# Patient Record
Sex: Female | Born: 1985 | Race: Black or African American | Hispanic: No | Marital: Married | Smoking: Current every day smoker
Health system: Southern US, Community
[De-identification: ages and names within clinical notes are randomized; demographics above are authoritative.]

## PROBLEM LIST (undated history)

## (undated) DIAGNOSIS — R569 Unspecified convulsions: Secondary | ICD-10-CM

## (undated) DIAGNOSIS — D649 Anemia, unspecified: Secondary | ICD-10-CM

## (undated) DIAGNOSIS — I10 Essential (primary) hypertension: Secondary | ICD-10-CM

## (undated) DIAGNOSIS — Z789 Other specified health status: Secondary | ICD-10-CM

## (undated) DIAGNOSIS — Z8614 Personal history of Methicillin resistant Staphylococcus aureus infection: Secondary | ICD-10-CM

## (undated) HISTORY — PX: NO PAST SURGERIES: SHX2092

---

## 2009-10-11 ENCOUNTER — Emergency Department (HOSPITAL_COMMUNITY): Admission: EM | Admit: 2009-10-11 | Discharge: 2009-10-11 | Payer: Self-pay | Admitting: Emergency Medicine

## 2010-02-08 ENCOUNTER — Emergency Department (HOSPITAL_COMMUNITY): Admission: EM | Admit: 2010-02-08 | Discharge: 2010-02-08 | Payer: Self-pay | Admitting: Emergency Medicine

## 2010-07-20 NOTE — L&D Delivery Note (Addendum)
Delivery Note At 9:21 AM a viable female was delivered via Vaginal, Spontaneous Delivery (Presentation: Left Occiput Anterior).  APGAR: 9, 9; weight 7 lb 3.7 oz (3280 g).   Placenta status: Intact, Spontaneous.  Cord: 3 vessels with the following complications: PPH EBL 800cc, IV pitocin and cytotec 1000mg  PR given.   Anesthesia: Epidural  Episiotomy: None Lacerations: 1st degree;Perineal Suture Repair: 3.0 vicryl Est. Blood Loss (mL): 800  Mom to AICU for Magnesium x 24 hours.   Baby to nursery-stable. Blood pressure stable 114/74 Plans on IUD and bottle feeding.   Omar Orrego 06/01/2011, 9:55 AM

## 2010-10-04 LAB — POCT I-STAT, CHEM 8
BUN: 5 mg/dL — ABNORMAL LOW (ref 6–23)
Calcium, Ion: 1.12 mmol/L (ref 1.12–1.32)
Chloride: 107 mEq/L (ref 96–112)
Creatinine, Ser: 0.8 mg/dL (ref 0.4–1.2)
Glucose, Bld: 97 mg/dL (ref 70–99)
Potassium: 3.7 mEq/L (ref 3.5–5.1)
Sodium: 140 mEq/L (ref 135–145)

## 2010-10-04 LAB — DIFFERENTIAL
Basophils Absolute: 0.1 10*3/uL (ref 0.0–0.1)
Eosinophils Absolute: 0.1 10*3/uL (ref 0.0–0.7)
Lymphocytes Relative: 14 % (ref 12–46)
Lymphs Abs: 1.5 10*3/uL (ref 0.7–4.0)
Neutro Abs: 8.2 10*3/uL — ABNORMAL HIGH (ref 1.7–7.7)
Neutrophils Relative %: 80 % — ABNORMAL HIGH (ref 43–77)

## 2010-10-04 LAB — COMPREHENSIVE METABOLIC PANEL
ALT: 14 U/L (ref 0–35)
Albumin: 3.9 g/dL (ref 3.5–5.2)
Alkaline Phosphatase: 80 U/L (ref 39–117)
Creatinine, Ser: 0.68 mg/dL (ref 0.4–1.2)
Glucose, Bld: 97 mg/dL (ref 70–99)
Potassium: 3.5 mEq/L (ref 3.5–5.1)
Total Bilirubin: 0.4 mg/dL (ref 0.3–1.2)
Total Protein: 7.8 g/dL (ref 6.0–8.3)

## 2010-10-04 LAB — CULTURE, BLOOD (ROUTINE X 2): Culture: NO GROWTH

## 2010-10-04 LAB — LACTIC ACID, PLASMA: Lactic Acid, Venous: 1.5 mmol/L (ref 0.5–2.2)

## 2010-10-04 LAB — POCT CARDIAC MARKERS: Myoglobin, poc: 36.3 ng/mL (ref 12–200)

## 2010-10-04 LAB — CBC
Hemoglobin: 13.6 g/dL (ref 12.0–15.0)
Platelets: 194 10*3/uL (ref 150–400)
RDW: 14.9 % (ref 11.5–15.5)
WBC: 10.3 10*3/uL (ref 4.0–10.5)

## 2010-10-13 LAB — URINE MICROSCOPIC-ADD ON

## 2010-10-13 LAB — URINALYSIS, ROUTINE W REFLEX MICROSCOPIC
Bilirubin Urine: NEGATIVE
Ketones, ur: NEGATIVE mg/dL
Leukocytes, UA: NEGATIVE
Protein, ur: 30 mg/dL — AB
Specific Gravity, Urine: 1.02 (ref 1.005–1.030)
pH: 6.5 (ref 5.0–8.0)

## 2010-10-13 LAB — POCT I-STAT, CHEM 8
BUN: 9 mg/dL (ref 6–23)
Calcium, Ion: 1.16 mmol/L (ref 1.12–1.32)
Chloride: 109 mEq/L (ref 96–112)
Creatinine, Ser: 0.8 mg/dL (ref 0.4–1.2)
Glucose, Bld: 95 mg/dL (ref 70–99)
HCT: 41 % (ref 36.0–46.0)
Hemoglobin: 13.9 g/dL (ref 12.0–15.0)
Potassium: 4.3 mEq/L (ref 3.5–5.1)
Sodium: 143 mEq/L (ref 135–145)

## 2010-10-13 LAB — COMPREHENSIVE METABOLIC PANEL
BUN: 8 mg/dL (ref 6–23)
Calcium: 8.9 mg/dL (ref 8.4–10.5)
Creatinine, Ser: 0.81 mg/dL (ref 0.4–1.2)
Glucose, Bld: 97 mg/dL (ref 70–99)
Sodium: 142 mEq/L (ref 135–145)

## 2010-10-13 LAB — CBC: RDW: 14.9 % (ref 11.5–15.5)

## 2011-02-11 ENCOUNTER — Inpatient Hospital Stay (HOSPITAL_COMMUNITY): Payer: Medicaid Other

## 2011-02-11 ENCOUNTER — Emergency Department (HOSPITAL_COMMUNITY)
Admission: EM | Admit: 2011-02-11 | Discharge: 2011-02-11 | Disposition: A | Discharge status: Other Acute Inpatient Hospital | Payer: Medicaid Other | Source: Home / Self Care | Attending: Emergency Medicine | Admitting: Emergency Medicine

## 2011-02-11 ENCOUNTER — Encounter (HOSPITAL_COMMUNITY): Payer: Self-pay | Admitting: *Deleted

## 2011-02-11 ENCOUNTER — Inpatient Hospital Stay (HOSPITAL_COMMUNITY)
Admission: AD | Admit: 2011-02-11 | Discharge: 2011-02-11 | Disposition: A | Discharge status: Home / Self Care | Payer: Medicaid Other | Source: Ambulatory Visit | Attending: Obstetrics & Gynecology | Admitting: Obstetrics & Gynecology

## 2011-02-11 DIAGNOSIS — R109 Unspecified abdominal pain: Secondary | ICD-10-CM

## 2011-02-11 DIAGNOSIS — O0930 Supervision of pregnancy with insufficient antenatal care, unspecified trimester: Secondary | ICD-10-CM | POA: Insufficient documentation

## 2011-02-11 DIAGNOSIS — O9989 Other specified diseases and conditions complicating pregnancy, childbirth and the puerperium: Secondary | ICD-10-CM

## 2011-02-11 DIAGNOSIS — O26899 Other specified pregnancy related conditions, unspecified trimester: Secondary | ICD-10-CM

## 2011-02-11 HISTORY — DX: Essential (primary) hypertension: I10

## 2011-02-11 HISTORY — DX: Anemia, unspecified: D64.9

## 2011-02-11 LAB — CBC
MCH: 27 pg (ref 26.0–34.0)
MCV: 80.2 fL (ref 78.0–100.0)
RBC: 3.93 MIL/uL (ref 3.87–5.11)
WBC: 9.1 10*3/uL (ref 4.0–10.5)

## 2011-02-11 LAB — URINE MICROSCOPIC-ADD ON

## 2011-02-11 LAB — URINALYSIS, ROUTINE W REFLEX MICROSCOPIC
Ketones, ur: NEGATIVE mg/dL
Protein, ur: NEGATIVE mg/dL

## 2011-02-11 LAB — ABO/RH: ABO/RH(D): B POS

## 2011-02-11 LAB — BASIC METABOLIC PANEL
Calcium: 8.7 mg/dL (ref 8.4–10.5)
Chloride: 102 mEq/L (ref 96–112)
Creatinine, Ser: <0.47 mg/dL — ABNORMAL LOW (ref 0.50–1.10)
Potassium: 3.3 mEq/L — ABNORMAL LOW (ref 3.5–5.1)

## 2011-02-11 LAB — DIFFERENTIAL
Eosinophils Absolute: 0.2 10*3/uL (ref 0.0–0.7)
Eosinophils Relative: 2 % (ref 0–5)
Lymphs Abs: 1.8 10*3/uL (ref 0.7–4.0)
Neutro Abs: 6.7 10*3/uL (ref 1.7–7.7)

## 2011-02-11 NOTE — ED Provider Notes (Addendum)
History    Patient is 25 year old black female presents today from [facility]. Patient states that she began having some upper abdominal pain early this morning that radiated down to her lower abdomen. She states the pain would come and go. She denies vaginal discharge or bleeding. She states that she now is having minimal to no pain. She is uncertain of how far along she is.  No chief complaint on file.  HPI  OB History    Grav Para Term Preterm Abortions TAB SAB Ect Mult Living   5 4 2 2  0 0 0 0 0 4      Past Medical History  Diagnosis Date  . Hypertension   . Anemia     Past Surgical History  Procedure Date  . No past surgeries     No family history on file.  History  Substance Use Topics  . Smoking status: Not on file  . Smokeless tobacco: Not on file  . Alcohol Use: Not on file    Allergies:  Allergies  Allergen Reactions  . Promethazine Hcl Other (See Comments)    hallucinations    No prescriptions prior to admission    Review of Systems  Constitutional: Negative for fever and chills.  Eyes: Negative for blurred vision and double vision.  Cardiovascular: Negative for chest pain and palpitations.  Gastrointestinal: Positive for abdominal pain. Negative for nausea, vomiting, diarrhea and constipation.  Genitourinary: Negative for dysuria, urgency, frequency, hematuria and flank pain.  Neurological: Negative for dizziness and headaches.  Psychiatric/Behavioral: Negative for depression and suicidal ideas.   Physical Exam   Blood pressure 129/67, temperature 98.9 F (37.2 C), temperature source Oral, resp. rate 16.  Physical Exam  Constitutional: She is oriented to person, place, and time. She appears well-developed and well-nourished. No distress.  HENT:  Head: Normocephalic and atraumatic.  Eyes: EOM are normal. Pupils are equal, round, and reactive to light.  GI: Soft. She exhibits no distension and no mass. There is no tenderness. There is  no rebound and no guarding.  Genitourinary: No bleeding around the vagina.       The cervix is long and closed.  Neurological: She is alert and oriented to person, place, and time.  Skin: Skin is warm and dry. She is not diaphoretic.  Psychiatric: She has a normal mood and affect. Her behavior is normal. Judgment and thought content normal.    MAU Course  Procedures  NST is reactive for 24 weeks. No contractions are noted.  Ultrasound demonstrates a single living IUP with a gestational age of [redacted] weeks and 5 days. No fetal anomalies are seen involving the visualized anatomy. Her EDD is 05/29/2011. Her cervical length is 4.41 cm.  Assessment and Plan  Pregnancy with no prenatal care: I did discuss this with her at length. She plans to followup with Dr. Tamela Oddi. I did discuss with her private diet, activities, risks and precautions. She understood this and agreed. She had no other questions or problems at this time.  Clinton Gallant. Bonnita Newby III, DrHSc, MPAS, PA-C  02/11/2011, 5:57 PM

## 2011-02-11 NOTE — Progress Notes (Signed)
Pt having abdominal pain in early AM.

## 2011-03-26 ENCOUNTER — Inpatient Hospital Stay (HOSPITAL_COMMUNITY): Payer: Medicaid Other

## 2011-03-26 ENCOUNTER — Inpatient Hospital Stay (HOSPITAL_COMMUNITY)
Admission: AD | Admit: 2011-03-26 | Discharge: 2011-03-26 | Disposition: A | Discharge status: Home / Self Care | Payer: Medicaid Other | Source: Ambulatory Visit | Attending: Obstetrics & Gynecology | Admitting: Obstetrics & Gynecology

## 2011-03-26 ENCOUNTER — Encounter (HOSPITAL_COMMUNITY): Payer: Self-pay

## 2011-03-26 DIAGNOSIS — N949 Unspecified condition associated with female genital organs and menstrual cycle: Secondary | ICD-10-CM | POA: Insufficient documentation

## 2011-03-26 DIAGNOSIS — O169 Unspecified maternal hypertension, unspecified trimester: Secondary | ICD-10-CM | POA: Insufficient documentation

## 2011-03-26 DIAGNOSIS — O99330 Smoking (tobacco) complicating pregnancy, unspecified trimester: Secondary | ICD-10-CM | POA: Insufficient documentation

## 2011-03-26 DIAGNOSIS — O9989 Other specified diseases and conditions complicating pregnancy, childbirth and the puerperium: Secondary | ICD-10-CM | POA: Insufficient documentation

## 2011-03-26 LAB — RUBELLA SCREEN: Rubella: 11.6 IU/mL — ABNORMAL HIGH

## 2011-03-26 LAB — COMPREHENSIVE METABOLIC PANEL
BUN: <3 mg/dL — ABNORMAL LOW (ref 6–23)
CO2: 24 mEq/L (ref 19–32)
Chloride: 102 mEq/L (ref 96–112)
Creatinine, Ser: <0.47 mg/dL — ABNORMAL LOW (ref 0.50–1.10)
Glucose, Bld: 78 mg/dL (ref 70–99)
Total Bilirubin: 0.2 mg/dL — ABNORMAL LOW (ref 0.3–1.2)

## 2011-03-26 LAB — CBC
MCH: 26.9 pg (ref 26.0–34.0)
MCHC: 33.8 g/dL (ref 30.0–36.0)
Platelets: 199 10*3/uL (ref 150–400)
RBC: 3.87 MIL/uL (ref 3.87–5.11)

## 2011-03-26 LAB — DIFFERENTIAL
Basophils Absolute: 0 10*3/uL (ref 0.0–0.1)
Basophils Relative: 0 % (ref 0–1)
Eosinophils Absolute: 0.1 10*3/uL (ref 0.0–0.7)
Lymphs Abs: 1.6 10*3/uL (ref 0.7–4.0)
Neutrophils Relative %: 79 % — ABNORMAL HIGH (ref 43–77)

## 2011-03-26 LAB — URINALYSIS, ROUTINE W REFLEX MICROSCOPIC
Hgb urine dipstick: NEGATIVE
Urobilinogen, UA: 1 mg/dL (ref 0.0–1.0)
pH: 7 (ref 5.0–8.0)

## 2011-03-26 LAB — HEPATITIS B SURFACE ANTIGEN: Hepatitis B Surface Ag: NEGATIVE

## 2011-03-26 MED ORDER — SODIUM CHLORIDE 0.65 % NA SOLN: 1.0000 | NASAL | Status: DC | PRN

## 2011-03-26 MED ORDER — LORATADINE 10 MG PO TABS: 10.0000 mg | ORAL_TABLET | Freq: Every day | ORAL | Status: DC

## 2011-03-26 NOTE — ED Provider Notes (Signed)
Chief Complaint:  Abdominal Pain   Kathy Hutchinson is  25 y.o. [phone] at [redacted]w[redacted]d presents complaining of Abdominal Pain The patient states that she has had gradual onset of sharp lower abdominal/back pain over the last week.  It is worse with movement, making it particularly difficult to walk.  Also difficult to find a comfortable position lying down.  Tylenol has not been helping the pain.  Did have one episode of loose stool yesterday, but none since.  Also complains of a "cold" that has lasted 2-3 days with congestion, cough, subjective fever/chills. Some nausea and one episode of vomiting 2 days ago.  Has a poor appetite, but able to keep fluids down.  Also states has a bump under the skin on her abdomen that has increased in size with the pregnancy.  She states no contractions, vaginal bleeding or loss of fluid.  Reports good fetal movement with last FM within the hour.   Obstetrical/Gynecological History: OB History    Grav Para Term Preterm Abortions TAB SAB Ect Mult Living   5 4 2 2  0 0 0 0 0 4      Past Medical History: Past Medical History  Diagnosis Date  . Hypertension   . Anemia   . Preterm labor     Past Surgical History: Past Surgical History  Procedure Date  . No past surgeries     Family History: No family history on file.  Social History: History  Substance Use Topics  . Smoking status: Current Everyday Smoker -- 0.2 packs/day  . Smokeless tobacco: Never Used  . Alcohol Use: No    Allergies:  Allergies  Allergen Reactions  . Promethazine Hcl Other (See Comments)    hallucinations    Prescriptions prior to admission  Medication Sig Dispense Refill  . prenatal vitamin w/FE, FA (PRENATAL 1 + 1) 27-1 MG TABS Take 1 tablet by mouth daily.          Review of Systems - Negative except as in HPI.  Physical Exam   Blood pressure 132/89, pulse 111, temperature 98.5 F (36.9 C), temperature source Oral, resp. rate 20, SpO2 100.00%.  General: General  appearance - alert, cooperative, mild distress, well-hydrated Eyes - sclera anicteric Mouth - mucous membranes moist, pharynx normal without lesions Neck - supple, no significant adenopathy Chest - clear to auscultation, no wheezes, rales. Rhonchi present that clear with cough, symmetric air entry Heart - normal rate, regular rhythm, normal S1, S2, no murmurs, rubs, clicks or gallops Abdomen - soft, +BS, gravid Extremities - peripheral pulses normal, no pedal edema, no clubbing or cyanosis Skin - has a 1cm hypopigmented lesion on her abdomen with an underlying 2mm mobile, non-tender mass; has a 2mm hypopigmented lesion on abdomen.  Baseline: 150 bpm, Variability: Good {> 6 bpm), Accelerations: Non-reactive but appropriate for gestational age and Decelerations: Absent Contractions: none Pelvic exam: external genitalia normal; vagina normal with moderate amount of white discharge; cervix visualized and appears normal Cervix: closed, thick, posterior   Labs: Recent Results (from the past 24 hour(s))  URINALYSIS, ROUTINE W REFLEX MICROSCOPIC   Collection Time   03/26/11 12:16 PM      Component Value Range   Color, Urine YELLOW  YELLOW    Appearance CLEAR  CLEAR    Specific Gravity, Urine 1.010  1.005 - 1.030    pH 7.0  5.0 - 8.0    Glucose, UA NEGATIVE  NEGATIVE (mg/dL)   Hgb urine dipstick NEGATIVE  NEGATIVE  Bilirubin Urine NEGATIVE  NEGATIVE    Ketones, ur 15 (*) NEGATIVE (mg/dL)   Protein, ur NEGATIVE  NEGATIVE (mg/dL)   Urobilinogen, UA 1.0  0.0 - 1.0 (mg/dL)   Nitrite NEGATIVE  NEGATIVE    Leukocytes, UA NEGATIVE  NEGATIVE   CBC   Collection Time   03/26/11  2:09 PM      Component Value Range   WBC 10.5  4.0 - 10.5 (K/uL)   RBC 3.87  3.87 - 5.11 (MIL/uL)   Hemoglobin 10.4 (*) 12.0 - 15.0 (g/dL)   HCT 16.1 (*) 09.6 - 46.0 (%)   MCV 79.6  78.0 - 100.0 (fL)   MCH 26.9  26.0 - 34.0 (pg)   MCHC 33.8  30.0 - 36.0 (g/dL)   RDW 04.5  40.9 - 81.1 (%)   Platelets 199  150 - 400  (K/uL)  DIFFERENTIAL   Collection Time   03/26/11  2:09 PM      Component Value Range   Neutrophils Relative 79 (*) 43 - 77 (%)   Neutro Abs 8.3 (*) 1.7 - 7.7 (K/uL)   Lymphocytes Relative 15  12 - 46 (%)   Lymphs Abs 1.6  0.7 - 4.0 (K/uL)   Monocytes Relative 5  3 - 12 (%)   Monocytes Absolute 0.5  0.1 - 1.0 (K/uL)   Eosinophils Relative 1  0 - 5 (%)   Eosinophils Absolute 0.1  0.0 - 0.7 (K/uL)   Basophils Relative 0  0 - 1 (%)   Basophils Absolute 0.0  0.0 - 0.1 (K/uL)  COMPREHENSIVE METABOLIC PANEL   Collection Time   03/26/11  2:09 PM      Component Value Range   Sodium 136  135 - 145 (mEq/L)   Potassium 3.2 (*) 3.5 - 5.1 (mEq/L)   Chloride 102  96 - 112 (mEq/L)   CO2 24  19 - 32 (mEq/L)   Glucose, Bld 78  70 - 99 (mg/dL)   BUN <3 (*) 6 - 23 (mg/dL)   Creatinine, Ser <9.14 (*) 0.50 - 1.10 (mg/dL)   Calcium 8.9  8.4 - 78.2 (mg/dL)   Total Protein 6.7  6.0 - 8.3 (g/dL)   Albumin 3.0 (*) 3.5 - 5.2 (g/dL)   AST 12  0 - 37 (U/L)   ALT 8  0 - 35 (U/L)   Alkaline Phosphatase 151 (*) 39 - 117 (U/L)   Total Bilirubin 0.2 (*) 0.3 - 1.2 (mg/dL)   GFR calc non Af Amer NOT CALCULATED  >60 (mL/min)   GFR calc Af Amer NOT CALCULATED  >60 (mL/min)  WET PREP, GENITAL   Collection Time   03/26/11  2:25 PM      Component Value Range   Yeast, Wet Prep NONE SEEN  NONE SEEN    Trich, Wet Prep NONE SEEN  NONE SEEN    Clue Cells, Wet Prep NONE SEEN  NONE SEEN    WBC, Wet Prep HPF POC MODERATE (*) NONE SEEN    Imaging Studies:  No results found.   Assessment: Kathy Hutchinson is  25 y.o. [phone] at [redacted]w[redacted]d presents with lower abdominal pain and URI.  Abdominal pain differential includes round ligament pain, STI, UTI.  Likely round ligament pain as UA is negative for UTI and wet prep is normal.  -chronic HTN -no prenatal care -hx of preterm labor  Plan: -check prenatal labs -CMP, UA, GC/Chlamydia, wet prep -Tylenol for round ligament pain -discussed support belt to help with pelvic/back  pain -  for URI can also use a nasal saline spray and loratidine. -will schedule her for MFM follow up due to abnormality found on prior ultrasound - ultrasound on 9/7 at 11am -will schedule for OB follow up in high risk clinic - appt on Tues for labs, appt on Thurs to see provider   Patient seen and discussed with Dr. Natale Milch. BOOTH, ERIN9/6/20123:01 PM

## 2011-03-26 NOTE — Progress Notes (Signed)
Pt states lower abd pain for days, pain is constant, feels like a pulling starting from her sides down into her lower abdomen. Also has cough/congestion, runny nose, shortness of breath. Denies vaginal d/c changes or bleeding, +FM today. Did have a day where she felt no fetal mvmt, no pnc thus far.

## 2011-03-26 NOTE — Progress Notes (Signed)
Pt states she has had a cold for 3 days, has had right upper chest and right arm pain, lower abdominal pain worse with walking. No bleeding or leaking. Has had no prenatal care, but has been seen in [facility].

## 2011-03-27 ENCOUNTER — Ambulatory Visit (HOSPITAL_COMMUNITY)
Admission: RE | Admit: 2011-03-27 | Discharge: 2011-03-27 | Disposition: A | Discharge status: Home / Self Care | Payer: Medicaid Other | Source: Ambulatory Visit | Attending: Obstetrics & Gynecology | Admitting: Obstetrics & Gynecology

## 2011-03-27 ENCOUNTER — Encounter (HOSPITAL_COMMUNITY): Payer: Self-pay

## 2011-03-27 ENCOUNTER — Ambulatory Visit (HOSPITAL_COMMUNITY): Admit: 2011-03-27 | Payer: Medicaid Other

## 2011-03-27 ENCOUNTER — Other Ambulatory Visit: Payer: Self-pay | Admitting: Obstetrics & Gynecology

## 2011-03-27 DIAGNOSIS — Z8751 Personal history of pre-term labor: Secondary | ICD-10-CM | POA: Insufficient documentation

## 2011-03-27 DIAGNOSIS — O359XX0 Maternal care for (suspected) fetal abnormality and damage, unspecified, not applicable or unspecified: Secondary | ICD-10-CM

## 2011-03-27 DIAGNOSIS — O0930 Supervision of pregnancy with insufficient antenatal care, unspecified trimester: Secondary | ICD-10-CM | POA: Insufficient documentation

## 2011-03-27 DIAGNOSIS — O99330 Smoking (tobacco) complicating pregnancy, unspecified trimester: Secondary | ICD-10-CM | POA: Insufficient documentation

## 2011-03-27 LAB — GC/CHLAMYDIA PROBE AMP, GENITAL
Chlamydia, DNA Probe: NEGATIVE
GC Probe Amp, Genital: NEGATIVE

## 2011-03-27 NOTE — ED Provider Notes (Signed)
Pt seen and discussed with Dr Elwyn Reach. Agree with above. Pt given appt in [facility] on Sept 13.

## 2011-03-27 NOTE — Progress Notes (Signed)
Report in [facility]; follow-up as needed. Please review information about gestational age; there are discrepancies between patient stated LMP of 09/02/2010 with EDD of 11/20/201; estimated due date of 06/15/2011; and assigned due date of 05/29/2011 based on 9 day difference from LMP at time of 25 week scan ( convention to change EDD if U/S discrepancy is >2 weeks)

## 2011-03-31 ENCOUNTER — Encounter: Payer: Self-pay | Admitting: Obstetrics and Gynecology

## 2011-05-31 ENCOUNTER — Inpatient Hospital Stay (HOSPITAL_COMMUNITY): Payer: Self-pay

## 2011-05-31 ENCOUNTER — Inpatient Hospital Stay (HOSPITAL_COMMUNITY): Payer: Self-pay | Admitting: Anesthesiology

## 2011-05-31 ENCOUNTER — Encounter (HOSPITAL_COMMUNITY): Payer: Self-pay | Admitting: Anesthesiology

## 2011-05-31 ENCOUNTER — Inpatient Hospital Stay (HOSPITAL_COMMUNITY)
Admission: AD | Admit: 2011-05-31 | Discharge: 2011-06-03 | DRG: 774 | Disposition: A | Discharge status: Home / Self Care | Payer: Self-pay | Source: Ambulatory Visit | Attending: Obstetrics & Gynecology | Admitting: Obstetrics & Gynecology

## 2011-05-31 ENCOUNTER — Encounter (HOSPITAL_COMMUNITY): Payer: Self-pay | Admitting: *Deleted

## 2011-05-31 ENCOUNTER — Other Ambulatory Visit: Payer: Self-pay

## 2011-05-31 DIAGNOSIS — O9903 Anemia complicating the puerperium: Secondary | ICD-10-CM | POA: Diagnosis not present

## 2011-05-31 DIAGNOSIS — D649 Anemia, unspecified: Secondary | ICD-10-CM | POA: Diagnosis not present

## 2011-05-31 DIAGNOSIS — O0930 Supervision of pregnancy with insufficient antenatal care, unspecified trimester: Secondary | ICD-10-CM

## 2011-05-31 DIAGNOSIS — O152 Eclampsia in the puerperium: Secondary | ICD-10-CM | POA: Diagnosis not present

## 2011-05-31 DIAGNOSIS — F410 Panic disorder [episodic paroxysmal anxiety] without agoraphobia: Secondary | ICD-10-CM | POA: Diagnosis present

## 2011-05-31 DIAGNOSIS — IMO0002 Reserved for concepts with insufficient information to code with codable children: Principal | ICD-10-CM | POA: Diagnosis present

## 2011-05-31 DIAGNOSIS — Z8614 Personal history of Methicillin resistant Staphylococcus aureus infection: Secondary | ICD-10-CM | POA: Insufficient documentation

## 2011-05-31 DIAGNOSIS — O139 Gestational [pregnancy-induced] hypertension without significant proteinuria, unspecified trimester: Secondary | ICD-10-CM

## 2011-05-31 DIAGNOSIS — R079 Chest pain, unspecified: Secondary | ICD-10-CM | POA: Diagnosis not present

## 2011-05-31 HISTORY — DX: Personal history of Methicillin resistant Staphylococcus aureus infection: Z86.14

## 2011-05-31 HISTORY — DX: Unspecified convulsions: R56.9

## 2011-05-31 HISTORY — DX: Other specified health status: Z78.9

## 2011-05-31 LAB — CBC
Hemoglobin: 9.4 g/dL — ABNORMAL LOW (ref 12.0–15.0)
MCH: 26.4 pg (ref 26.0–34.0)
MCV: 79.9 fL (ref 78.0–100.0)
Platelets: 225 10*3/uL (ref 150–400)
Platelets: 206 10*3/uL (ref 150–400)
RBC: 3.56 MIL/uL — ABNORMAL LOW (ref 3.87–5.11)
RDW: 15.1 % (ref 11.5–15.5)
WBC: 13 10*3/uL — ABNORMAL HIGH (ref 4.0–10.5)
WBC: 12.1 10*3/uL — ABNORMAL HIGH (ref 4.0–10.5)

## 2011-05-31 LAB — MRSA PCR SCREENING: MRSA by PCR: NEGATIVE

## 2011-05-31 LAB — COMPREHENSIVE METABOLIC PANEL
AST: 24 U/L (ref 0–37)
Albumin: 2.9 g/dL — ABNORMAL LOW (ref 3.5–5.2)
Calcium: 9.1 mg/dL (ref 8.4–10.5)
Chloride: 99 mEq/L (ref 96–112)
Creatinine, Ser: 0.48 mg/dL — ABNORMAL LOW (ref 0.50–1.10)

## 2011-05-31 LAB — PRO B NATRIURETIC PEPTIDE: Pro B Natriuretic peptide (BNP): 178.5 pg/mL — ABNORMAL HIGH (ref 0–125)

## 2011-05-31 LAB — PROTEIN / CREATININE RATIO, URINE
Protein Creatinine Ratio: 0.18 — ABNORMAL HIGH (ref 0.00–0.15)
Total Protein, Urine: 12.5 mg/dL

## 2011-05-31 LAB — CARDIAC PANEL(CRET KIN+CKTOT+MB+TROPI)
CK, MB: 2 ng/mL (ref 0.3–4.0)
Total CK: 37 U/L (ref 7–177)

## 2011-05-31 LAB — RAPID HIV SCREEN (WH-MAU): Rapid HIV Screen: NONREACTIVE

## 2011-05-31 LAB — RPR: RPR Ser Ql: NONREACTIVE

## 2011-05-31 MED ORDER — NALBUPHINE SYRINGE 5 MG/0.5 ML
5.0000 mg | INJECTION | INTRAMUSCULAR | Status: DC | PRN
  Administered 2011-05-31: 5 mg via INTRAVENOUS
  Filled 2011-05-31: qty 0.5

## 2011-05-31 MED ORDER — IBUPROFEN 600 MG PO TABS: 600.0000 mg | ORAL_TABLET | Freq: Four times a day (QID) | ORAL | Status: DC | PRN

## 2011-05-31 MED ORDER — FENTANYL 2.5 MCG/ML BUPIVACAINE 1/10 % EPIDURAL INFUSION (WH - ANES)
14.0000 mL/h | INTRAMUSCULAR | Status: DC
  Administered 2011-05-31 – 2011-06-01 (×4): 14 mL/h via EPIDURAL
  Filled 2011-05-31 (×5): qty 60

## 2011-05-31 MED ORDER — LIDOCAINE HCL 1.5 % IJ SOLN
INTRAMUSCULAR | Status: DC | PRN
  Administered 2011-05-31 (×2): 5 mL via EPIDURAL

## 2011-05-31 MED ORDER — HYDROXYZINE HCL 50 MG PO TABS
50.0000 mg | ORAL_TABLET | Freq: Four times a day (QID) | ORAL | Status: DC | PRN
  Filled 2011-05-31: qty 1

## 2011-05-31 MED ORDER — EPHEDRINE 5 MG/ML INJ: 10.0000 mg | INTRAVENOUS | Status: DC | PRN

## 2011-05-31 MED ORDER — ACETAMINOPHEN 325 MG PO TABS
650.0000 mg | ORAL_TABLET | ORAL | Status: DC | PRN
  Administered 2011-06-01 – 2011-06-02 (×4): 650 mg via ORAL
  Filled 2011-05-31 (×4): qty 2

## 2011-05-31 MED ORDER — TERBUTALINE SULFATE 1 MG/ML IJ SOLN: 0.2500 mg | Freq: Once | INTRAMUSCULAR | Status: AC | PRN

## 2011-05-31 MED ORDER — FENTANYL 2.5 MCG/ML BUPIVACAINE 1/10 % EPIDURAL INFUSION (WH - ANES)
INTRAMUSCULAR | Status: DC | PRN
  Administered 2011-05-31: 14 mL/h via EPIDURAL

## 2011-05-31 MED ORDER — HYDROXYZINE HCL 50 MG/ML IM SOLN
50.0000 mg | Freq: Four times a day (QID) | INTRAMUSCULAR | Status: DC | PRN
  Filled 2011-05-31: qty 1

## 2011-05-31 MED ORDER — DIPHENHYDRAMINE HCL 50 MG/ML IJ SOLN: 12.5000 mg | INTRAMUSCULAR | Status: DC | PRN

## 2011-05-31 MED ORDER — LACTATED RINGERS IV SOLN
500.0000 mL | INTRAVENOUS | Status: DC | PRN
Start: 2011-05-31 — End: 2011-06-01
  Administered 2011-05-31: 500 mL via INTRAVENOUS

## 2011-05-31 MED ORDER — PHENYLEPHRINE 40 MCG/ML (10ML) SYRINGE FOR IV PUSH (FOR BLOOD PRESSURE SUPPORT): 80.0000 ug | PREFILLED_SYRINGE | INTRAVENOUS | Status: DC | PRN

## 2011-05-31 MED ORDER — MAGNESIUM SULFATE 40 G IN LACTATED RINGERS - SIMPLE
2.0000 g/h | INTRAVENOUS | Status: DC
  Administered 2011-05-31 – 2011-06-01 (×2): 2 g/h via INTRAVENOUS
  Filled 2011-05-31 (×2): qty 500

## 2011-05-31 MED ORDER — CITRIC ACID-SODIUM CITRATE 334-500 MG/5ML PO SOLN: 30.0000 mL | ORAL | Status: DC | PRN

## 2011-05-31 MED ORDER — OXYTOCIN BOLUS FROM INFUSION
500.0000 mL | Freq: Once | INTRAVENOUS | Status: DC
  Filled 2011-05-31: qty 500

## 2011-05-31 MED ORDER — OXYCODONE-ACETAMINOPHEN 5-325 MG PO TABS
2.0000 | ORAL_TABLET | ORAL | Status: DC | PRN
  Administered 2011-05-31: 2 via ORAL
  Administered 2011-06-01: 1 via ORAL
  Filled 2011-05-31: qty 1
  Filled 2011-05-31: qty 2
  Filled 2011-05-31: qty 1

## 2011-05-31 MED ORDER — ZOLPIDEM TARTRATE 10 MG PO TABS: 10.0000 mg | ORAL_TABLET | Freq: Every evening | ORAL | Status: DC | PRN

## 2011-05-31 MED ORDER — MAGNESIUM SULFATE BOLUS VIA INFUSION
4.0000 g | Freq: Once | INTRAVENOUS | Status: AC
  Administered 2011-05-31: 4 g via INTRAVENOUS
  Filled 2011-05-31: qty 500

## 2011-05-31 MED ORDER — LACTATED RINGERS IV SOLN
INTRAVENOUS | Status: DC
  Administered 2011-05-31 (×2): 50 mL/h via INTRAVENOUS
  Administered 2011-05-31: 08:00:00 via INTRAVENOUS
  Administered 2011-06-01: 125 mL/h via INTRAVENOUS

## 2011-05-31 MED ORDER — LACTATED RINGERS IV SOLN: INTRAVENOUS | Status: DC

## 2011-05-31 MED ORDER — OXYTOCIN 20 UNITS IN LACTATED RINGERS INFUSION - SIMPLE
INTRAVENOUS | Status: AC
  Administered 2011-05-31: 2 m[IU]/min via INTRAVENOUS
  Filled 2011-05-31: qty 1000

## 2011-05-31 MED ORDER — LACTATED RINGERS IV SOLN: 500.0000 mL | Freq: Once | INTRAVENOUS | Status: DC

## 2011-05-31 MED ORDER — ONDANSETRON HCL 4 MG/2ML IJ SOLN
4.0000 mg | Freq: Four times a day (QID) | INTRAMUSCULAR | Status: DC | PRN
  Administered 2011-05-31: 4 mg via INTRAVENOUS
  Filled 2011-05-31 (×2): qty 2

## 2011-05-31 MED ORDER — FLEET ENEMA 7-19 GM/118ML RE ENEM: 1.0000 | ENEMA | RECTAL | Status: DC | PRN

## 2011-05-31 MED ORDER — OXYTOCIN 20 UNITS IN LACTATED RINGERS INFUSION - SIMPLE
1.0000 m[IU]/min | INTRAVENOUS | Status: DC
  Administered 2011-05-31: 2 m[IU]/min via INTRAVENOUS
  Administered 2011-05-31: 16 m[IU]/min via INTRAVENOUS

## 2011-05-31 MED ORDER — OXYTOCIN 20 UNITS IN LACTATED RINGERS INFUSION - SIMPLE
125.0000 mL/h | Freq: Once | INTRAVENOUS | Status: AC
  Administered 2011-06-01: 999 mL/h via INTRAVENOUS

## 2011-05-31 MED ORDER — LIDOCAINE HCL (PF) 1 % IJ SOLN: 30.0000 mL | INTRAMUSCULAR | Status: DC | PRN

## 2011-05-31 NOTE — Progress Notes (Signed)
Kathy Hutchinson is a 25 y.o. [phone] at [redacted]w[redacted]d admitted for induction of labor due to Hypertension, rule out Pre-eclampsia   Subjective: Strong pain with cramps, would like IV pain med, considering epidural later  Objective: BP 134/76  Pulse 98  Temp(Src) 98.2 F (36.8 C) (Oral)  Resp 18  Ht 5\' 1"  (1.549 m)  Wt 74.39 kg (164 lb)  BMI 30.99 kg/m2  SpO2 98%  LMP 09/02/2010   Total I/O In: 547.5 [P.O.:360; I.V.:187.5] Out: 400 [Urine:400]  Neuro: 2+ patellar reflexes  FHT:  FHR: 130 bpm, variability: moderate,  accelerations:  Present,  decelerations:  Absent UC:   regular, every 5 minutes SVE:   Dilation: 3 Effacement (%): 50 Station: -3 Exam by:: patti moore rn  Labs: Lab Results  Component Value Date   WBC 13.0* 05/31/2011   HGB 9.6* 05/31/2011   HCT 29.1* 05/31/2011   MCV 79.9 05/31/2011   PLT 225 05/31/2011    Protein/Creatinine ratio: 0.18    Assessment / Plan: Induction of labor due to gestational hypertension,  progressing well on pitocin  Labor: Progressing on Pitocin, will continue to increase then AROM Preeclampsia:  on magnesium sulfate, no signs of toxicity, no evidence of current pre-eclampsia  Fetal Wellbeing:  Category I Pain Control:  Going to receive Nubain or epidural  I/D:  GBS unknown,MRSA negative  Anticipated MOD:  NSVD Cardiomyopathy - consider postpartum cardiology consult   Mat Carne 05/31/2011, 3:24 PM

## 2011-05-31 NOTE — Progress Notes (Signed)
Mckynna Vanloan is a 25 y.o. [phone] at [redacted]w[redacted]d admitted for induction of labor due to Hypertension.  Subjective: Patient with headache, feeling moderate contractions, no pain medication yet   Objective: BP 132/80  Pulse 91  Temp(Src) 98 F (36.7 C) (Oral)  Resp 20  Ht 5\' 1"  (1.549 m)  Wt 74.39 kg (164 lb)  BMI 30.99 kg/m2  SpO2 98%  LMP 09/02/2010   Total I/O In: 742.5 [P.O.:480; I.V.:262.5] Out: 950 [Urine:950]  FHT:  FHR: 130 bpm, variability: moderate,  accelerations:  Present,  decelerations:  Present variable x 1 UC:   regular, every 5 minutes SVE:   Dilation: 4 Effacement (%): 60 Station: -2 Exam by:: dr Roslynn Amble  Assessment / Plan:  Labor: IOL, progressing slowly on pitocin, continue to titrate  Preeclampsia:  on magnesium sulfate and no signs or symptoms of toxicity Fetal Wellbeing:  Category I Pain Control:  None yet I/D:  n/a Anticipated MOD:  NSVD  Mat Carne 05/31/2011, 5:22 PM

## 2011-05-31 NOTE — Progress Notes (Signed)
   Roniqua Kintz is a 25 y.o. [phone] at [redacted]w[redacted]d  admitted for active labor, GHTN, r/o Preeclampsia  Subjective:  C/O headache, no visual changes Objective: BP 134/87  Pulse 100  Temp(Src) 98.2 F (36.8 C) (Oral)  Resp 18  Ht 5\' 1"  (1.549 m)  Wt 74.39 kg (164 lb)  BMI 30.99 kg/m2  SpO2 98%  LMP 09/02/2010 Total I/O In: 402.5 [P.O.:240; I.V.:162.5] Out: 400 [Urine:400]  FHT:  FHR: 140 bpm, variability: moderate,  accelerations:  Present,  decelerations:  Absent UC:   regular, every 5-6 minutes SVE:   Dilation: 3 Effacement (%): 50 Station: -3 Exam by:: patti moore rn  Labs: Lab Results  Component Value Date   WBC 13.0* 05/31/2011   HGB 9.6* 05/31/2011   HCT 29.1* 05/31/2011   MCV 79.9 05/31/2011   PLT 225 05/31/2011    Assessment / Plan: Spontaneous labor, slowed down by MgSO4; GHTN, r/o preeclampsia  Labor: early labor Fetal Wellbeing:  Category I Pain Control:  Labor support without medications Anticipated MOD:  NSVD  CRESENZO-DISHMAN,Krystelle Prashad 05/31/2011, 1:32 PM

## 2011-05-31 NOTE — Progress Notes (Signed)
   Kathy Hutchinson is a 25 y.o. [phone] at [redacted]w[redacted]d  admitted for induction of labor due to Hypertension.  Subjective:  HA gone after IV nubain Objective: BP 129/72  Pulse 100  Temp(Src) 98 F (36.7 C) (Oral)  Resp 20  Ht 5\' 1"  (1.549 m)  Wt 74.39 kg (164 lb)  BMI 30.99 kg/m2  SpO2 98%  LMP 09/02/2010 Total I/O In: 25 [I.V.:25] Out: -   FHT:  FHR: 140 bpm, variability: moderate,  accelerations:  Present,  decelerations:  Absent UC:   regular, every 2-3 minutes; Pit @ 18 mu/min SVE:   Dilation: 4 Effacement (%): 60 Station: -2 Exam by:: fran cres-dishmon cnm  Labs: Lab Results  Component Value Date   WBC 13.0* 05/31/2011   HGB 9.6* 05/31/2011   HCT 29.1* 05/31/2011   MCV 79.9 05/31/2011   PLT 225 05/31/2011    Assessment / Plan: Protracted latent phase  Labor: AROM with clear fluid Fetal Wellbeing:  Category I Pain Control:  Labor support without medications Anticipated MOD:  NSVD  CRESENZO-DISHMAN,Kathy Hutchinson 05/31/2011, 7:04 PM

## 2011-05-31 NOTE — Anesthesia Preprocedure Evaluation (Addendum)
Anesthesia Evaluation  Patient identified by MRN, date of birth, ID band Patient awake    Reviewed: Allergy & Precautions, H&P , NPO status , Patient's Chart, lab work & pertinent test results  Airway Mallampati: III TM Distance: >3 FB Neck ROM: full    Dental No notable dental hx.    Pulmonary neg pulmonary ROS,    Pulmonary exam normal       Cardiovascular hypertension, neg cardio ROS     Neuro/Psych Seizures -,     GI/Hepatic negative GI ROS, Neg liver ROS,   Endo/Other  Negative Endocrine ROS  Renal/GU negative Renal ROS  Genitourinary negative   Musculoskeletal negative musculoskeletal ROS (+)   Abdominal Normal abdominal exam  (+)   Peds negative pediatric ROS (+)  Hematology negative hematology ROS (+)   Anesthesia Other Findings   Reproductive/Obstetrics (+) Pregnancy                          Anesthesia Physical Anesthesia Plan  ASA: III  Anesthesia Plan: Epidural   Post-op Pain Management:    Induction:   Airway Management Planned:   Additional Equipment:   Intra-op Plan:   Post-operative Plan:   Informed Consent: I have reviewed the patients History and Physical, chart, labs and discussed the procedure including the risks, benefits and alternatives for the proposed anesthesia with the patient or authorized representative who has indicated his/her understanding and acceptance.     Plan Discussed with:   Anesthesia Plan Comments:         Anesthesia Quick Evaluation

## 2011-05-31 NOTE — Anesthesia Procedure Notes (Signed)
Epidural Patient location during procedure: OB Start time: 05/31/2011 8:45 PM End time: 05/31/2011 8:54 PM Reason for block: procedure for pain  Staffing Anesthesiologist: Sandrea Hughs Performed by: anesthesiologist   Preanesthetic Checklist Completed: patient identified, site marked, surgical consent, pre-op evaluation, timeout performed, IV checked, risks and benefits discussed and monitors and equipment checked  Epidural Patient position: sitting Prep: site prepped and draped and DuraPrep Patient monitoring: continuous pulse ox and blood pressure Approach: midline Injection technique: LOR air  Needle:  Needle type: Tuohy  Needle gauge: 17 G Needle length: 9 cm Needle insertion depth: 7 cm Catheter type: closed end flexible Catheter size: 19 Gauge Catheter at skin depth: 12 cm Test dose: negative and 1.5% lidocaine  Assessment Sensory level: T8 Events: blood not aspirated, injection not painful, no injection resistance, negative IV test and no paresthesia

## 2011-05-31 NOTE — Progress Notes (Signed)
Requested lab to draw rapid HIV and CBC for epidural.

## 2011-05-31 NOTE — Progress Notes (Signed)
Pt reports UC's since waking up this am. Pt also states that her chest feels "heavy". Pt reports one episode of nausea.

## 2011-05-31 NOTE — Progress Notes (Signed)
   Georgene Kopper is a 25 y.o. [phone] at [redacted]w[redacted]d  admitted for induction of labor due to Hypertension.  Subjective: Has a 'hot spot' in middle of stomach  Objective: BP 138/86  Pulse 101  Temp(Src) 98.6 F (37 C) (Oral)  Resp 20  Ht 5\' 1"  (1.549 m)  Wt 74.39 kg (164 lb)  BMI 30.99 kg/m2  SpO2 98%  LMP 09/02/2010 Total I/O In: 100 [I.V.:100] Out: -   FHT:  FHR: 140 bpm, variability: moderate,  accelerations:  Present,  decelerations:  Absent UC:   irregular, every 2-3 minutes SVE:   Dilation: 5 Effacement (%): 80 Station: -2 Exam by:: Frankey Poot, RN  Labs: Lab Results  Component Value Date   WBC 12.1* 05/31/2011   HGB 9.4* 05/31/2011   HCT 28.5* 05/31/2011   MCV 80.1 05/31/2011   PLT 206 05/31/2011    Assessment / Plan: Protracted latent phase  Labor: pit at 18 mu/min  Fetal Wellbeing:  Category I Pain Control:  Epidural Anticipated MOD:  NSVD  CRESENZO-DISHMAN,Tarahji Ramthun 05/31/2011, 9:42 PM

## 2011-05-31 NOTE — H&P (Signed)
Kathy Hutchinson is a 25 y.o. female presenting for active labor and pre-eclampsia.   History Kathy Hutchinson presents with left-sided chest pain, severe abdominal pain, nausea, and vomiting. These symptoms come in the setting of term pregnancy with no pre-natal care and a history of eclampsia and cardiomyopathy with the first pregnancy. The chest pain started this morning. It is intermittent and radiates to the left arm. It is also associated with shortness of breath. She has not taken any medication or tried any intervention to reduce the pain. Kathy Hutchinson had a panic attacks several days ago. However, she does not believe her current pain and SOB is similar to the panic attack.   The patient also notes headaches and spot in her vision. She has a history of hypertension, but is not currently on any medication. Her abdominal pain is left-sided and constant. She does not know if she is in labor or having contractions. She denies leakage of fluid or vaginal bleeding. She has had a swelling in her feet and hands.     OB History    Grav Para Term Preterm Abortions TAB SAB Ect Mult Living   5 4 2 2  0 0 0 0 0 4     Past Medical History  Diagnosis Date  . Hypertension   . Anemia   . Preterm labor   . No pertinent past medical history   . Hx MRSA infection   - possible cardiomyopathy (no records to verify) - Eclampsia    Past Surgical History  Procedure Date  . No past surgeries    No current facility-administered medications on file prior to encounter.   Current Outpatient Prescriptions on File Prior to Encounter  Medication Sig Dispense Refill  . prenatal vitamin w/FE, FA (PRENATAL 1 + 1) 27-1 MG TABS Take 1 tablet by mouth daily.          Family History: family history is not on file. Social History:  reports that she has been smoking.  She has never used smokeless tobacco. She reports that she does not drink alcohol or use illicit drugs. She smoke 1/4 packs per day.   ROS See HPI      Fetal Exam Fetal Monitor Review: Mode: fetoscope.   Baseline rate: 130.  Variability: moderate (6-25 bpm).   Pattern: no decelerations and accelerations present.    Fetal State Assessment: Category I - tracings are normal.     Physical Exam  Constitutional: She is oriented to person, place, and time. She appears lethargic.  Cardiovascular: Tachycardia present.  Exam reveals gallop. Exam reveals no friction rub.   Murmur heard. Respiratory: Effort normal. She has rhonchi.  GI:       Gravid, non-tender  Neurological: She is oriented to person, place, and time. She appears lethargic. She displays abnormal reflex.  Reflex Scores:      Patellar reflexes are 3+ on the right side and 3+ on the left side.  Dilation: 3 Effacement (%): 50 Cervical Position: Posterior Station: Ballotable Presentation: Vertex Exam by:: B Mosca RN  BP 144/87  Pulse 97  Temp(Src) 98.4 F (36.9 C) (Oral)  Resp 20  Ht 5\' 1"  (1.549 m)  Wt 74.39 kg (164 lb)  BMI 30.99 kg/m2  SpO2 98%  LMP 09/02/2010   Prenatal labs: ABO, Rh: --/--/B POS (07/25 1400) Antibody:   Rubella: 11.6 (09/06 1409) RPR: NON REACTIVE (09/06 1409)  HBsAg: NEGATIVE (09/06 1409)  HIV:   unknown  GBS:   unknown  Assessment/Plan: [name] is a 25 year at term with no pre-natal care who presents in labor who presents with possible pre-eclampsia and cardiomyopathy.  1. Admit to birthing suites under Dr. Despina Hidden 2. Hypertension - start Magnesium, check CMET, CBC, urine protein/creatinine 3. Chest Pain/History of Cardiomyopathy - EKG, cardiac enzymes, chest X-ray, BNP, consider cardiology consultation and echocardiogram 4. Infectious Disease - check MRSA swab, HIV    Kathy Hutchinson 05/31/2011, 9:33 AM

## 2011-06-01 ENCOUNTER — Encounter (HOSPITAL_COMMUNITY): Payer: Self-pay | Admitting: Family Medicine

## 2011-06-01 DIAGNOSIS — B20 Human immunodeficiency virus [HIV] disease: Secondary | ICD-10-CM

## 2011-06-01 LAB — CBC
MCV: 79.8 fL (ref 78.0–100.0)
Platelets: 264 10*3/uL (ref 150–400)
RBC: 2.67 MIL/uL — ABNORMAL LOW (ref 3.87–5.11)
RDW: 15.4 % (ref 11.5–15.5)
WBC: 22.6 10*3/uL — ABNORMAL HIGH (ref 4.0–10.5)

## 2011-06-01 LAB — HIV ANTIBODY (ROUTINE TESTING W REFLEX): HIV: NONREACTIVE

## 2011-06-01 LAB — MAGNESIUM: Magnesium: 6.9 mg/dL (ref 1.5–2.5)

## 2011-06-01 MED ORDER — SODIUM CHLORIDE 0.9 % IV SOLN
500.0000 mL | Freq: Once | INTRAVENOUS | Status: AC
  Administered 2011-06-01: 1000 mL via INTRAVENOUS

## 2011-06-01 MED ORDER — SENNOSIDES-DOCUSATE SODIUM 8.6-50 MG PO TABS
2.0000 | ORAL_TABLET | Freq: Every day | ORAL | Status: DC
  Administered 2011-06-02: 2 via ORAL

## 2011-06-01 MED ORDER — OXYTOCIN 20 UNITS IN LACTATED RINGERS INFUSION - SIMPLE
1.0000 m[IU]/min | INTRAVENOUS | Status: DC
  Administered 2011-06-01: 16 m[IU]/min via INTRAVENOUS
  Administered 2011-06-01: 10 m[IU]/min via INTRAVENOUS
  Administered 2011-06-01: 18 m[IU]/min via INTRAVENOUS
  Administered 2011-06-01: 14 m[IU]/min via INTRAVENOUS

## 2011-06-01 MED ORDER — PRENATAL PLUS 27-1 MG PO TABS
1.0000 | ORAL_TABLET | Freq: Every day | ORAL | Status: DC
  Administered 2011-06-02 – 2011-06-03 (×2): 1 via ORAL
  Filled 2011-06-01 (×2): qty 1

## 2011-06-01 MED ORDER — WITCH HAZEL-GLYCERIN EX PADS: 1.0000 "application " | MEDICATED_PAD | CUTANEOUS | Status: DC | PRN

## 2011-06-01 MED ORDER — ZOLPIDEM TARTRATE 5 MG PO TABS: 5.0000 mg | ORAL_TABLET | Freq: Every evening | ORAL | Status: DC | PRN

## 2011-06-01 MED ORDER — ONDANSETRON HCL 4 MG/2ML IJ SOLN: 4.0000 mg | INTRAMUSCULAR | Status: DC | PRN

## 2011-06-01 MED ORDER — ONDANSETRON HCL 4 MG/2ML IJ SOLN: 8.0000 mg | Freq: Once | INTRAMUSCULAR | Status: DC

## 2011-06-01 MED ORDER — MISOPROSTOL 200 MCG PO TABS: 1000.0000 ug | ORAL_TABLET | Freq: Once | ORAL | Status: DC

## 2011-06-01 MED ORDER — SIMETHICONE 80 MG PO CHEW: 80.0000 mg | CHEWABLE_TABLET | ORAL | Status: DC | PRN

## 2011-06-01 MED ORDER — DIBUCAINE 1 % RE OINT: 1.0000 "application " | TOPICAL_OINTMENT | RECTAL | Status: DC | PRN

## 2011-06-01 MED ORDER — BENZOCAINE-MENTHOL 20-0.5 % EX AERO
1.0000 "application " | INHALATION_SPRAY | CUTANEOUS | Status: DC | PRN
  Administered 2011-06-02: 1 via TOPICAL

## 2011-06-01 MED ORDER — CARBOPROST TROMETHAMINE 250 MCG/ML IM SOLN
250.0000 ug | Freq: Once | INTRAMUSCULAR | Status: AC
  Administered 2011-06-01: 250 ug via INTRAMUSCULAR

## 2011-06-01 MED ORDER — OXYCODONE-ACETAMINOPHEN 5-325 MG PO TABS
1.0000 | ORAL_TABLET | ORAL | Status: DC | PRN
  Administered 2011-06-02 (×2): 1 via ORAL
  Filled 2011-06-01 (×2): qty 1

## 2011-06-01 MED ORDER — MISOPROSTOL 200 MCG PO TABS
200.0000 ug | ORAL_TABLET | Freq: Once | ORAL | Status: AC
  Administered 2011-06-01: 200 ug via VAGINAL

## 2011-06-01 MED ORDER — DIPHENHYDRAMINE HCL 25 MG PO CAPS: 25.0000 mg | ORAL_CAPSULE | Freq: Four times a day (QID) | ORAL | Status: DC | PRN

## 2011-06-01 MED ORDER — ONDANSETRON HCL 4 MG PO TABS: 4.0000 mg | ORAL_TABLET | ORAL | Status: DC | PRN

## 2011-06-01 MED ORDER — HYDROMORPHONE HCL PF 1 MG/ML IJ SOLN
1.0000 mg | Freq: Once | INTRAMUSCULAR | Status: AC
  Administered 2011-06-01: 1 mg via INTRAVENOUS

## 2011-06-01 MED ORDER — IBUPROFEN 600 MG PO TABS
600.0000 mg | ORAL_TABLET | Freq: Four times a day (QID) | ORAL | Status: DC
  Administered 2011-06-01 – 2011-06-03 (×7): 600 mg via ORAL
  Filled 2011-06-01 (×7): qty 1

## 2011-06-01 MED ORDER — HYDROMORPHONE HCL PF 1 MG/ML IJ SOLN
INTRAMUSCULAR | Status: AC
  Administered 2011-06-01: 1 mg via INTRAVENOUS
  Filled 2011-06-01: qty 1

## 2011-06-01 MED ORDER — CARBOPROST TROMETHAMINE 250 MCG/ML IM SOLN
INTRAMUSCULAR | Status: AC
  Filled 2011-06-01: qty 1

## 2011-06-01 MED ORDER — DIPHENOXYLATE-ATROPINE 2.5-0.025 MG PO TABS
2.0000 | ORAL_TABLET | Freq: Four times a day (QID) | ORAL | Status: DC | PRN
  Administered 2011-06-01: 2 via ORAL
  Filled 2011-06-01 (×2): qty 1

## 2011-06-01 MED ORDER — TETANUS-DIPHTH-ACELL PERTUSSIS 5-2.5-18.5 LF-MCG/0.5 IM SUSP
0.5000 mL | Freq: Once | INTRAMUSCULAR | Status: AC
  Administered 2011-06-03: 0.5 mL via INTRAMUSCULAR
  Filled 2011-06-01: qty 0.5

## 2011-06-01 MED ORDER — MISOPROSTOL 200 MCG PO TABS
ORAL_TABLET | ORAL | Status: AC
  Administered 2011-06-01: 800 ug via RECTAL
  Filled 2011-06-01: qty 4

## 2011-06-01 MED ORDER — CARBOPROST TROMETHAMINE 250 MCG/ML IM SOLN
250.0000 ug | Freq: Once | INTRAMUSCULAR | Status: DC
  Filled 2011-06-01: qty 1

## 2011-06-01 MED ORDER — LANOLIN HYDROUS EX OINT: TOPICAL_OINTMENT | CUTANEOUS | Status: DC | PRN

## 2011-06-01 MED ORDER — MISOPROSTOL 200 MCG PO TABS
ORAL_TABLET | ORAL | Status: AC
  Administered 2011-06-01: 200 ug via VAGINAL
  Filled 2011-06-01: qty 1

## 2011-06-01 NOTE — Progress Notes (Signed)
Called Wynelle Bourgeois CNM regarding patient. Pt c/o 10/10 pain. Had tylenol at 2123. Per Hilda Lias, patient may have motrin.

## 2011-06-01 NOTE — Progress Notes (Signed)
Pt has no needs.

## 2011-06-01 NOTE — Progress Notes (Signed)
Dr Penne Lash in room. Pt moderately bleeding and passing several large clots. MD requesting manual exam. Additional staff called to assist

## 2011-06-01 NOTE — Progress Notes (Signed)
   Kathy Hutchinson is a 25 y.o. [phone] at [redacted]w[redacted]d  admitted for induction of labor due to Hypertension.  Subjective:  Feeling some light pressure Objective: BP 149/89  Pulse 100  Temp(Src) 98.6 F (37 C) (Oral)  Resp 18  Ht 5\' 1"  (1.549 m)  Wt 74.39 kg (164 lb)  BMI 30.99 kg/m2  SpO2 98%  LMP 09/02/2010 Total I/O In: 891 [I.V.:891] Out: 650 [Urine:650]  FHT:  FHR: 140 bpm, variability: moderate,  accelerations:  Present,  decelerations:  Absent UC:   regular, every 2-3 minutes IUPC placed SVE:   6/90/-1  Labs: Lab Results  Component Value Date   WBC 12.1* 05/31/2011   HGB 9.4* 05/31/2011   HCT 28.5* 05/31/2011   MCV 80.1 05/31/2011   PLT 206 05/31/2011    Assessment / Plan: Protracted active phase  Labor: beginning to progress.  IUPC placed Fetal Wellbeing:  Category I Pain Control:  Epidural Anticipated MOD:  NSVD  CRESENZO-DISHMAN,Kennya Schwenn 06/01/2011, 12:25 AM

## 2011-06-01 NOTE — Progress Notes (Signed)
  Feels weak and tired and has not yet been out of bed.  Bleeding small amount FF at U +1 Filed Vitals:   06/01/11 1402  BP: 121/62  Pulse: 103  Temp: 98.4 F (36.9 C)  Resp: 18   Results for orders placed during the hospital encounter of 05/31/11 (from the past 24 hour(s))  CARDIAC PANEL(CRET KIN+CKTOT+MB+TROPI)     Status: Normal   Collection Time   05/31/11  3:51 PM      Component Value Range   Total CK 37  7 - 177 (U/L)   CK, MB 2.4  0.3 - 4.0 (ng/mL)   Troponin I <0.30  <0.30 (ng/mL)   Relative Index RELATIVE INDEX IS INVALID  0.0 - 2.5   RAPID HIV SCREEN [facility])     Status: Normal   Collection Time   05/31/11  8:09 PM      Component Value Range   SUDS Rapid HIV Screen NON REACTIVE  NON REACTIVE   CBC     Status: Abnormal   Collection Time   05/31/11  8:09 PM      Component Value Range   WBC 12.1 (*) 4.0 - 10.5 (K/uL)   RBC 3.56 (*) 3.87 - 5.11 (MIL/uL)   Hemoglobin 9.4 (*) 12.0 - 15.0 (g/dL)   HCT 46.9 (*) 62.9 - 46.0 (%)   MCV 80.1  78.0 - 100.0 (fL)   MCH 26.4  26.0 - 34.0 (pg)   MCHC 33.0  30.0 - 36.0 (g/dL)   RDW 52.8  41.3 - 24.4 (%)   Platelets 206  150 - 400 (K/uL)  MAGNESIUM     Status: Abnormal   Collection Time   06/01/11  8:29 AM      Component Value Range   Magnesium 6.9 (*) 1.5 - 2.5 (mg/dL)  CBC     Status: Abnormal   Collection Time   06/01/11 11:49 AM      Component Value Range   WBC 22.6 (*) 4.0 - 10.5 (K/uL)   RBC 2.67 (*) 3.87 - 5.11 (MIL/uL)   Hemoglobin 7.0 (*) 12.0 - 15.0 (g/dL)   HCT 01.0 (*) 27.2 - 46.0 (%)   MCV 79.8  78.0 - 100.0 (fL)   MCH 26.2  26.0 - 34.0 (pg)   MCHC 32.9  30.0 - 36.0 (g/dL)   RDW 53.6  64.4 - 03.4 (%)   Platelets 264  150 - 400 (K/uL)  PREPARE RBC (CROSSMATCH)     Status: Normal   Collection Time   06/01/11 12:00 PM      Component Value Range   Order Confirmation ORDER PROCESSED BY BLOOD BANK    ASSESSMENT: PPH due to  atony due to prolonged magnesium exposure, multiparity and ? Fibroids by exam. Now  bleeding is stable but ABL anemia is symptomatic  PLAN: Discussed with Dr. Penne Lash will transfuse with 2u PRBC.  Explained risks/benefits/alternatives and Pt conseneted to transfusion.

## 2011-06-01 NOTE — Progress Notes (Signed)
   Kathy Hutchinson is a 25 y.o. [phone] at [redacted]w[redacted]d  admitted for induction of labor due to Hypertension.  Subjective: Comfortable with the epidural  Objective: BP 132/87  Pulse 100  Temp(Src) 98.7 F (37.1 C) (Oral)  Resp 20  Ht 5\' 1"  (1.549 m)  Wt 74.39 kg (164 lb)  BMI 30.99 kg/m2  SpO2 98%  LMP 09/02/2010 Total I/O In: 2106 [P.O.:900; I.V.:1206] Out: 1000 [Urine:1000]  FHT:  FHR: 140 bpm, variability: moderate,  accelerations:  Present,  decelerations:  Absent UC:   regular, every 3-5 minutes; pitocin has been @ 30 mu/min for several hours.  COntractions are spacing out, MVU's 150-220. SVE:   Dilation: 6 Effacement (%): 90 Station: -2 Exam by:: D biggs, Rn  Labs: Lab Results  Component Value Date   WBC 12.1* 05/31/2011   HGB 9.4* 05/31/2011   HCT 28.5* 05/31/2011   MCV 80.1 05/31/2011   PLT 206 05/31/2011    Assessment / Plan: Protracted active phase, prolonged Pitocin administration.  WIll d/c pitocin for 2 hours and restart at 10 mu/min  Labor: protracted Fetal Wellbeing:  Category I Pain Control:  Epidural Anticipated MOD:  NSVD  CRESENZO-DISHMAN,Giorgi Debruin 06/01/2011, 2:30 AM

## 2011-06-01 NOTE — Progress Notes (Signed)
Kathy Hutchinson is a 25 y.o. [phone] at [redacted]w[redacted]d admitted for induction of labor due to Hypertension. Hx cardiomyopathy.  Subjective: Pt complains of dry mouth, thirsty. Feels tired. No desire to push. Legs are numb.   Objective: BP 133/88  Pulse 97  Temp(Src) 98 F (36.7 C) (Oral)  Resp 18  Ht 5\' 1"  (1.549 m)  Wt 74.39 kg (164 lb)  BMI 30.99 kg/m2  SpO2 98%  LMP 09/02/2010 I/O last 3 completed shifts: In: 4700 [P.O.:1800; I.V.:2900] Out: 3375 [Urine:3375]  DTRs diminshed bilateral patellar reflexes.  No respiratory distress.    FHT:  FHR: 135 bpm, variability: moderate,  accelerations:  Present,  decelerations:  Absent UC:   regular, every 2-3 minutes SVE:   Dilation: 9 Effacement (%): 90 Station: -1 Exam by:: D Biggs Rn  Labs: Lab Results  Component Value Date   WBC 12.1* 05/31/2011   HGB 9.4* 05/31/2011   HCT 28.5* 05/31/2011   MCV 80.1 05/31/2011   PLT 206 05/31/2011    Assessment / Plan: Induction of labor due to gestational hypertension,  progressing well on pitocin.   Blood pressure controlled 130s/80s on magnesium sulfated.  Labor: Progressing on Pitocin at 9mu/min Preeclampsia:  on magnesium sulfate, diminished reflexes, will check mag level.  Fetal Wellbeing:  Category I Pain Control:  Epidural I/D:  n/a Anticipated MOD:  NSVD  Jeff Mccallum 06/01/2011, 7:26 AM

## 2011-06-01 NOTE — Anesthesia Postprocedure Evaluation (Signed)
Anesthesia Post Note  Patient: Kathy Hutchinson  Procedure(s) Performed: * No procedures listed *  Anesthesia type: Epidural  Patient location: Mother/Baby  Post pain: Pain level controlled  Post assessment: Post-op Vital signs reviewed  Last Vitals:  Filed Vitals:   06/01/11 1932  BP: 114/71  Pulse: 94  Temp: 37.7 C  Resp: 18    Post vital signs: Reviewed  Level of consciousness: awake  Complications: No apparent anesthesia complications

## 2011-06-01 NOTE — Progress Notes (Signed)
  Subjective:  Pt feeling cramping and increased vaginal bleeding.  Objective:  Fundal massage expelled approximately 200 cc.  Bimanual exam removed 200 more cc of clot and blood.  Uterus contracted for approx 10 mins.  Second bimanual exam removed 200 more cc of clot.  By the time of the second bimanual pt received hemabate x1.  200 mcg of cytotec placed per rectum.  Foley placed as well.  3rd bimanual revealed no clot or blood.  Pt received 2nd dose of hemabate.  Pt also received Dilaudid for pain during exam, zofran for nausea, and lomotil for prophylaxis to prevent diarrhea after 2 doses of hemabate.  A/P:  Sp/ NSVD and atony.  Total EBL for this encounter was 600 cc. CBC and 2 untis packed RBC prepared.  Pt not bleeding 10 mins after last bimanual.  Will watch carefully. Magnesium sulfate was discontinued since this can lead to uterine atony.  Pts protein/creatinine ratio was nml on admission.  Will watch for signs of preeclampsia.

## 2011-06-02 LAB — COMPREHENSIVE METABOLIC PANEL
AST: 18 U/L (ref 0–37)
Albumin: 1.8 g/dL — ABNORMAL LOW (ref 3.5–5.2)
Alkaline Phosphatase: 305 U/L — ABNORMAL HIGH (ref 39–117)
Chloride: 101 mEq/L (ref 96–112)
Potassium: 3.7 mEq/L (ref 3.5–5.1)
Total Bilirubin: 0.6 mg/dL (ref 0.3–1.2)

## 2011-06-02 LAB — CBC
MCH: 27.4 pg (ref 26.0–34.0)
MCHC: 34.9 g/dL (ref 30.0–36.0)
MCV: 78.6 fL (ref 78.0–100.0)
Platelets: 170 10*3/uL (ref 150–400)
RDW: 15.8 % — ABNORMAL HIGH (ref 11.5–15.5)

## 2011-06-02 LAB — TYPE AND SCREEN
ABO/RH(D): B POS
Antibody Screen: POSITIVE
DAT, IgG: NEGATIVE
Donor AG Type: NEGATIVE
Donor AG Type: NEGATIVE
PT AG Type: NEGATIVE
Unit division: 0
Unit division: 0
Unit division: 0

## 2011-06-02 MED ORDER — BENZOCAINE-MENTHOL 20-0.5 % EX AERO
INHALATION_SPRAY | CUTANEOUS | Status: AC
  Administered 2011-06-02: 1 via TOPICAL
  Filled 2011-06-02: qty 56

## 2011-06-02 MED ORDER — GUAIFENESIN 100 MG/5ML PO SOLN
5.0000 mL | ORAL | Status: DC | PRN
  Administered 2011-06-02: 100 mg via ORAL
  Filled 2011-06-02: qty 15

## 2011-06-02 NOTE — Addendum Note (Signed)
Addendum  created 06/02/11 0932 by Suella Grove   Modules edited:Charges VN, Notes Section

## 2011-06-02 NOTE — Progress Notes (Signed)
UR chart review completed.  

## 2011-06-02 NOTE — Progress Notes (Signed)
CRITICAL VALUE ALERT  Critical value received:  Hgb 6.8  Date of notification:  06/02/11  Time of notification:  0600  Critical value read back: yes  Nurse who received alert:  Hart Carwin  MD notified (1st page):  Wynelle Bourgeois, CNM  Time of first page: 0610  MD notified (2nd page):na  Time of second page:na  Responding MD:  Wynelle Bourgeois, CNM  Time MD responded:  [phone]

## 2011-06-02 NOTE — Anesthesia Postprocedure Evaluation (Signed)
  Anesthesia Post-op Note  Patient: Kathy Hutchinson  Procedure(s) Performed: * No procedures listed *  Patient Location: Mother/Baby  Anesthesia Type: Epidural  Level of Consciousness: awake  Airway and Oxygen Therapy: Patient Spontanous Breathing  Post-op Pain: none  Post-op Assessment: Patient's Cardiovascular Status Stable, Respiratory Function Stable, Patent Airway, No signs of Nausea or vomiting, Adequate PO intake and Pain level controlled  Post-op Vital Signs: Reviewed and stable  Complications: No apparent anesthesia complications

## 2011-06-02 NOTE — Addendum Note (Signed)
Addendum  created 06/02/11 0932 by Michaela Broski C Regenia Erck   Modules edited:Charges VN, Notes Section    

## 2011-06-02 NOTE — Progress Notes (Signed)
Post Partum Day 1 Subjective: no complaints, up ad lib, voiding and tolerating PO  Objective: Blood pressure 120/76, pulse 78, temperature 98.1 F (36.7 C), temperature source Oral, resp. rate 18, height 5\' 1"  (1.549 m), weight 74.39 kg (164 lb), last menstrual period 09/02/2010, SpO2 96.00%, unknown if currently breastfeeding. RN states pt has been up without syncope or dizziness.   Physical Exam:  General: alert, cooperative, fatigued and no distress Lochia: appropriate Uterine Fundus: firm Incision: n/a  DVT Evaluation: No evidence of DVT seen on physical exam. Negative Homan's sign.   Basename 06/02/11 0540 06/02/11 0100  HGB 6.8* 7.3*  HCT 19.5* 21.5*    Assessment/Plan:  Will watch for orthostasis as she increases activity today. I suspect this drop in Hgb may be dilutional, secondary to third-spacing.   Plan for discharge tomorrow and Contraception Depo Provera then IUD   LOS: 2 days   [facility] 06/02/2011, 6:21 AM

## 2011-06-02 NOTE — Progress Notes (Signed)
Assisted patient with stedy and NT, Kathy Hutchinson  to bathroom to change pads and do peri care.  Patient states she has pressure in her bottom and her back hurts where the epidural is.  Patient last had Motrin at 2319 on 06/01/2011.  Dermaplast spray and ice packs took the edge off pain per patient.

## 2011-06-02 NOTE — Progress Notes (Signed)
PSYCHOSOCIAL ASSESSMENT ~ MATERNAL/CHILD Name: Lashone Stauber                                                                     Age: 25   Referral Date:       55 / 26  / 46  Reason/Source: NPNC / CN  I. FAMILY/HOME ENVIRONMENT A. Child's Legal Guardian _X__Parent(s) ___Grandparent ___Foster parent ___DSS_________________ Name: Roslyn Smiling                                DOB: //                     Age: 57  Address: [address] ; [place], [place] [address]  Name:   Yarelis Ambrosino                                 DOB: //                     Age:  15   Address:  B. Other Household Members/Support Persons Name:                                         Relationship:  Son 82yr                     DOB ___/___/___                   Name:                                         Relationship:  Son 69yr                     DOB ___/___/___                   Name:                                         Relationship:   Daughter 46yr           DOB ___/___/___                   Name:                                         Relationship:   Daughter 64yr            DOB ___/___/___  C. Other Support:   II. PSYCHOSOCIAL DATA A. Information Source  _X_Patient Interview  __Family Interview           __Other___________  B. Surveyor, quantity and [employer] __Employment: _X_Medicaid    [place]                __Private Insurance:                   __Self Pay  _X_Food Stamps   __WIC __[employer]     __Public Housing     __Section 8    __Maternity Care Coordination/Child Service Coordination/Early Intervention   ___School:                                                                         Grade:  __Other:   Salena Saner Cultural and Environment Information Cultural Issues Impacting Care:  III. STRENGTHS _X__Supportive family/friends _X__Adequate Resources ___Compliance with medical plan _X__Home  prepared for Child (including basic supplies) ___Understanding of illness      ___Other: RISK FACTORS AND CURRENT PROBLEMS         ____No Problems Noted                            NPNC                                                                                                                                                                                                                        IV. SOCIAL WORK ASSESSMENT  Pt is a 25 year old, G5P5 referred for [facility].  Pt found out about pregnancy at 3 months but "waited a little while" before she applied for Medicaid.  Additionally, pt told Sw that she wasn't stable at the time and had "a lot going on" that prevented her from seeking medical attention.  She reports that she is stable now and living in her own apartment.  Pt told Sw that she did attend 2 appointments in the clinic and MAU during the pregnancy.  Pt explained that she started to take prenatal vitamins, "immediately" upon positive UPT.  She denies any illegal substance use.  UDS is negative and meconium is pending.  Pt has supplies for the infant but expressed  need for additional clothing.  Sw provided pt with a bundle pack of clothes.  Pt reports having support from FOB and his family.  Sw will follow up with drug screen results and make a referral if needed.      V. SOCIAL WORK PLAN  _X__No Further Intervention Required/No Barriers to Discharge   ___Psychosocial Support and Ongoing Assessment of Needs   ___Patient/Family Education:   ___Child Protective Services Report   County___________ Date___/____/____   ___Information/Referral to [employer]_________________________   ___Other:

## 2011-06-02 NOTE — Addendum Note (Signed)
Addendum  created 06/02/11 1051 by Karleen Dolphin   Modules edited:Anesthesia LDA

## 2011-06-02 NOTE — Addendum Note (Signed)
Addendum  created 06/02/11 1051 by Avah Bashor   Modules edited:Anesthesia LDA    

## 2011-06-03 MED ORDER — AMLODIPINE BESYLATE 10 MG PO TABS
10.0000 mg | ORAL_TABLET | Freq: Every day | ORAL | Status: DC
  Administered 2011-06-03: 10 mg via ORAL
  Filled 2011-06-03 (×2): qty 1

## 2011-06-03 MED ORDER — AMLODIPINE BESYLATE 5 MG PO TABS: 10.0000 mg | ORAL_TABLET | Freq: Every day | ORAL | Status: DC

## 2011-06-03 MED ORDER — HYDROXYZINE HCL 50 MG PO TABS: 50.0000 mg | ORAL_TABLET | Freq: Every evening | ORAL | Status: DC | PRN

## 2011-06-03 MED ORDER — SENNOSIDES-DOCUSATE SODIUM 8.6-50 MG PO TABS: 2.0000 | ORAL_TABLET | Freq: Every day | ORAL | Status: DC

## 2011-06-03 MED ORDER — IBUPROFEN 600 MG PO TABS: 600.0000 mg | ORAL_TABLET | Freq: Four times a day (QID) | ORAL | Status: DC

## 2011-06-03 MED ORDER — FERROUS SULFATE 325 (65 FE) MG PO TABS: 325.0000 mg | ORAL_TABLET | Freq: Every day | ORAL | Status: DC

## 2011-06-03 MED ORDER — IBUPROFEN 600 MG PO TABS: 600.0000 mg | ORAL_TABLET | Freq: Four times a day (QID) | ORAL | Status: AC

## 2011-06-03 MED ORDER — HYDROCHLOROTHIAZIDE 25 MG PO TABS: 25.0000 mg | ORAL_TABLET | Freq: Every day | ORAL | Status: DC

## 2011-06-03 MED ORDER — AMLODIPINE BESYLATE 5 MG PO TABS: 5.0000 mg | ORAL_TABLET | ORAL | Status: DC

## 2011-06-03 MED ORDER — AMLODIPINE BESYLATE 5 MG PO TABS: 5.0000 mg | ORAL_TABLET | Freq: Every day | ORAL | Status: DC

## 2011-06-03 NOTE — Progress Notes (Signed)
  S/O:  Called by RN re: elevated BP prior to going home.          Has rx for HCTZ but has not been on it here         BP    Filed Vitals:   06/03/11 1207  BP: 171/106  Pulse: 68  Temp:   Resp:     A:  Gestational Hypertension      New elevation in BP P:  Consulted Dr Macon Large      Will give a dose of Norvasc and if Bp lowers, will d/c home on Norvasc 10 mg QD

## 2011-06-03 NOTE — Progress Notes (Signed)
Post Partum Day 2 Subjective: no complaints, up ad lib, voiding and tolerating PO  Objective: Blood pressure 159/84, pulse 79, temperature 98.3 F (36.8 C), temperature source Oral, resp. rate 18, height 5\' 1"  (1.549 m), weight 74.39 kg (164 lb), last menstrual period 09/02/2010, SpO2 96.00%, unknown if currently breastfeeding.  BP range past 24 hrs: 113-138/71-94  BP Readings from Last 3 Encounters:  06/03/11 159/84  03/26/11 132/85  02/11/11 133/73     Physical Exam:  General: alert and cooperative Lochia: appropriate Uterine Fundus: firm DVT Evaluation: No cords or calf tenderness. No significant calf/ankle edema.   Basename 06/02/11 0540 06/02/11 0100  HGB 6.8* 7.3*  HCT 19.5* 21.5*    Assessment/Plan: Social Work consult and Contraception plans PP BTL, paperwork signed this admission WIll have outpt cardiology f/u for remote hx of cardiomyopathy per patient (records not available to confirm).   LOS: 3 days   Kurtiss Wence 06/03/2011, 7:25 AM

## 2011-06-03 NOTE — Discharge Summary (Signed)
Obstetric Discharge Summary Reason for Admission:  severe hypertension, chest pain, and induction of labor  Prenatal Procedures: NST Intrapartum Procedures: spontaneous vaginal delivery, uterine exploration and magnesium sulfate infusion Postpartum Procedures: transfusion 2 u pRBCs Complications-Operative and Postpartum: hemorrhage Hemoglobin  Date Value Range Status  06/02/2011 6.8* 12.0-15.0 (g/dL) Final     CRITICAL RESULT CALLED TO, READ BACK BY AND VERIFIED WITH:     STANIN,T. AT 8657 ON 06/02/2011 BY HOUEGNIFIO M     REPEATED TO VERIFY     DELTA CHECK NOTED     HCT  Date Value Range Status  06/02/2011 19.5* 36.0-46.0 (%) Final    Discharge Diagnoses: Term Pregnancy-delivered and gestational hypertension, blood loss anemia, postpartum hemorrhage due to uterine atony  25 yo [id number] presented via EMS at 40.2 weeks with severe hypertension 190/110 and chest pain. On arrival, hypertension persisted in 150/100 range, started on magnesium sulfate. Pr/cr ration was 0.14 and other PIH labs negative. Ruled out for MI. Underwent IOL due to severe hypertension and verbal history of 2x eclampsia and possible cardiomyopathy per patient. SHe had little PNC due to frequent relocations. Deliveres by SVD a viable female, but complicated by uterine atony and PPH with EBL ~1400, received pitocin, cytotec, and methergine. Hgb was last 6.8 and bleeding resolved, magnesium was discontinued in the post partum period secondary to bleeding. SW consulted and cleared for DC, high risk for PP depression. Patient had isolated SBP in the 170s otherwise range 120-150s/80s prior to DC.  She was started on HCTZ and Norvasc for HTN and f/u in [facility] clinic in 1 week. Plan for outpatient cardiology evaluation and referral to evaluate for cardiomyopathy, no concerning symptoms during this hospitalization. Signed papers for PP BTL and desires Depo until procedure. Bottlefeeding infant.   Discharge Information: Date:  06/03/2011 Activity: pelvic rest Diet: routine Medications: PNV, Ibuprofen, Colace, Iron, Norvasc, HCTZ, hydroxyzine prn Condition: stable Instructions: refer to practice specific booklet Discharge to: home Follow-up Information    Follow up with [facility] Make an appointment in 1 week. (as scheduled)    Contact information:   [address] [place] [address]       Follow up with Lloyd Huger, MD. Call in 6 weeks.   Contact information:   [address] [place] [address] [phone]          Newborn Data: Live born female  Birth Weight: 7 lb 3.7 oz (3280 g) APGAR: 9, 9  Home with mother.  KONKOL,JILL 06/03/2011, 8:57 AM   Attestation of Attending Supervision of Resident: Evaluation and management procedures were performed by the [facility] Medicine Resident under my supervision.  I have reviewed the resident's note, chart reviewed and agree with management and plan.  Jaynie Collins, M.D. 06/05/2011 2:33 PM

## 2011-06-05 ENCOUNTER — Telehealth: Payer: Self-pay | Admitting: *Deleted

## 2011-06-05 NOTE — Telephone Encounter (Signed)
After consult and reccommendations via Epic from Dr. Jolayne Panther called patient.  Patient states her prescriptions  Haven't been filled because her Medicaid was not approved. Informed patient she can get her Norvasc for $4 at [facility], can also get her hctz at [facility] or [facility] for $4, can continue to take her Prenatal Vitamins that she had prenatally, can take over the counter ibuprofen( counseled to take 3 over the counter 200mg  every 6 hours as needed) and can take over the counter ferrous sultate- instructed her to ask Pharmacist for help.   Kathy Hutchinson also c/o cramping and breast pain=5 with breast engorgement.( is bottlefeeding)  Instructed patient to take Ibuprofen 600mg  every 6 hours until not needed for pain, continue to use peribottle each times uses restroom, may soak in clean bathtub twice a day or as tolerated, wear snug fitting bra, avoid breast stimulation, apply ice packs twenty minutes at a time several times a day.  Informed patient if pain becomes severe, come to MAU at any time. Also reminded patient no intercourse until postpartum check.  Patient voices understanding of plan and agreement with plan of care.

## 2011-06-05 NOTE — Telephone Encounter (Signed)
Patient husband Kathy Hutchinson Armed forces operational officer of Mother/Baby Kathy Schaumann, RN to report that [name] was having trouble getting her BP meds due to were denied medicaid, also c/o his wife having breast and abdominal pain and needing to talk to someone. [name] came to clinic to ask for assistance. Per chart review patient not seen in clinic yet, but has appointment for future care- will route to our OB Dr. On call for assistance.

## 2011-06-10 ENCOUNTER — Inpatient Hospital Stay (HOSPITAL_COMMUNITY)
Admission: AD | Admit: 2011-06-10 | Discharge: 2011-06-10 | Disposition: A | Discharge status: Home / Self Care | Payer: Medicaid Other | Source: Ambulatory Visit | Attending: Family Medicine | Admitting: Family Medicine

## 2011-06-10 ENCOUNTER — Encounter (HOSPITAL_COMMUNITY): Payer: Self-pay | Admitting: *Deleted

## 2011-06-10 ENCOUNTER — Ambulatory Visit: Payer: Medicaid Other

## 2011-06-10 DIAGNOSIS — R1013 Epigastric pain: Secondary | ICD-10-CM

## 2011-06-10 DIAGNOSIS — IMO0001 Reserved for inherently not codable concepts without codable children: Secondary | ICD-10-CM | POA: Insufficient documentation

## 2011-06-10 DIAGNOSIS — M549 Dorsalgia, unspecified: Secondary | ICD-10-CM | POA: Insufficient documentation

## 2011-06-10 DIAGNOSIS — O99893 Other specified diseases and conditions complicating puerperium: Secondary | ICD-10-CM | POA: Insufficient documentation

## 2011-06-10 LAB — CBC
HCT: 27.1 % — ABNORMAL LOW (ref 36.0–46.0)
MCHC: 32.8 g/dL (ref 30.0–36.0)
MCV: 81.4 fL (ref 78.0–100.0)
RDW: 15.3 % (ref 11.5–15.5)

## 2011-06-10 LAB — COMPREHENSIVE METABOLIC PANEL
Albumin: 2.9 g/dL — ABNORMAL LOW (ref 3.5–5.2)
BUN: 12 mg/dL (ref 6–23)
Chloride: 103 mEq/L (ref 96–112)
Creatinine, Ser: 0.79 mg/dL (ref 0.50–1.10)
Total Bilirubin: 0.4 mg/dL (ref 0.3–1.2)
Total Protein: 6.3 g/dL (ref 6.0–8.3)

## 2011-06-10 LAB — AMYLASE: Amylase: 67 U/L (ref 0–105)

## 2011-06-10 LAB — LIPASE, BLOOD: Lipase: 42 U/L (ref 11–59)

## 2011-06-10 LAB — MAGNESIUM: Magnesium: 1.9 mg/dL (ref 1.5–2.5)

## 2011-06-10 MED ORDER — LACTATED RINGERS IV BOLUS (SEPSIS)
1000.0000 mL | Freq: Once | INTRAVENOUS | Status: AC
  Administered 2011-06-10: 1000 mL via INTRAVENOUS

## 2011-06-10 MED ORDER — CLONIDINE HCL 0.2 MG PO TABS
0.2000 mg | ORAL_TABLET | Freq: Once | ORAL | Status: AC
  Administered 2011-06-10: 0.2 mg via ORAL
  Filled 2011-06-10: qty 1

## 2011-06-10 MED ORDER — HYDROCHLOROTHIAZIDE 12.5 MG PO CAPS
25.0000 mg | ORAL_CAPSULE | Freq: Once | ORAL | Status: AC
  Administered 2011-06-10: 25 mg via ORAL
  Filled 2011-06-10: qty 2

## 2011-06-10 MED ORDER — HYDROMORPHONE HCL PF 1 MG/ML IJ SOLN
1.0000 mg | Freq: Once | INTRAMUSCULAR | Status: AC
  Administered 2011-06-10: 1 mg via INTRAVENOUS
  Filled 2011-06-10: qty 1

## 2011-06-10 MED ORDER — HYDRALAZINE HCL 20 MG/ML IJ SOLN
10.0000 mg | Freq: Once | INTRAMUSCULAR | Status: AC
  Administered 2011-06-10: 10 mg via INTRAVENOUS
  Filled 2011-06-10: qty 1

## 2011-06-10 MED ORDER — LABETALOL HCL 5 MG/ML IV SOLN
INTRAVENOUS | Status: AC
  Administered 2011-06-10: 10 mg via INTRAVENOUS
  Filled 2011-06-10: qty 4

## 2011-06-10 MED ORDER — AMLODIPINE BESYLATE 10 MG PO TABS
10.0000 mg | ORAL_TABLET | Freq: Once | ORAL | Status: AC
  Administered 2011-06-10: 10 mg via ORAL
  Filled 2011-06-10: qty 1

## 2011-06-10 MED ORDER — LABETALOL HCL 5 MG/ML IV SOLN
10.0000 mg | Freq: Once | INTRAVENOUS | Status: AC
  Administered 2011-06-10: 10 mg via INTRAVENOUS

## 2011-06-10 MED ORDER — LABETALOL HCL 5 MG/ML IV SOLN
10.0000 mg | Freq: Once | INTRAVENOUS | Status: AC
  Administered 2011-06-10: 10 mg via INTRAVENOUS
  Filled 2011-06-10: qty 4

## 2011-06-10 NOTE — Progress Notes (Signed)
Notified of lowering BP's will be down to see pt.

## 2011-06-10 NOTE — Progress Notes (Signed)
PT ARRIVED VIA EMS- WITH  ABD PAIN - SAYS HAS BEEN HURTING SINCE CAME HOME- BUT WORSE  TONIGHT  AT 12 MN-  AWOKE FROM SLEEP.   PAIN IS CONSTANT-   TOOK ADVIL ON  Friday.

## 2011-06-10 NOTE — ED Notes (Signed)
Dr. Darrol Darlinda at bedside to eval pt

## 2011-06-10 NOTE — ED Provider Notes (Signed)
Kathy Hutchinson is a 25 y.o. female presenting after delivery on 05/31/11 following an induction for hypertension for severe epigastric pain.  Per patient's report she has a history of eclampsia x2 as well has pregnancy induced hypertension.  However secondary to recurrent relocations records of this are not obtainable at this time.  During her most recent pregnancy she did have elevated pressures and was ultimately induced after presenting to the MAU with severe chest pain and pressures of 190/110 and then persistent pressures in the 150s/100s.  She was placed on Mg during her labor and 24 hours post-partum.  She was discharged on Norvasc 10mg  as well as HCTZ 25mg , however she states that she has not yet been able to obtain her medications.  Her delivery was also complicated by a post-partum hemorrhage estimated to be with a discharge Hg of 6.8.  AST and ALT as well as platelets were all within normal limits.    This evening she presents with complaints of severe epigastric pain which awoke her from sleep around midnight.  The patient states that this is different from her usual post-partum cramping in that it is located in her epigastrium and it is stabbing in nature.  She has not had pain like this in the past.  She has not taken anything to alleviate the pain and states that nothing in particular is making it worse.  She was concerned at the severity of the pain and therefore presented via EMS to the MAU for further evaluation. History OB History    Grav Para Term Preterm Abortions TAB SAB Ect Mult Living   5 5 3 2  0 0 0 0 0 5     Past Medical History  Diagnosis Date  . Hypertension   . Anemia   . Preterm labor   . No pertinent past medical history   . Hx MRSA infection   . Seizures    Past Surgical History  Procedure Date  . No past surgeries    Family History: family history includes Cancer in her paternal grandmother; Hypertension in her father, maternal grandmother, and paternal  grandmother; and Stroke in her paternal grandmother. Social History:  reports that she has been smoking.  She has never used smokeless tobacco. She reports that she does not drink alcohol or use illicit drugs.  ROS As per HPI   Blood pressure 166/98, pulse 75, temperature 98.6 F (37 C), temperature source Oral, resp. rate 20, last menstrual period 09/02/2010, unknown if currently breastfeeding. Exam Physical Exam  Constitutional: She is oriented to person, place, and time. She appears well-developed and well-nourished.  HENT:  Head: Normocephalic.  Eyes: Pupils are equal, round, and reactive to light.  Cardiovascular: Normal rate, regular rhythm and normal heart sounds.   Respiratory: Effort normal. She has no wheezes. She has no rales.  GI: She exhibits no mass. There is tenderness. There is no rebound and no guarding.       Tenderness is located in the epigastrium  Neurological: She is alert and oriented to person, place, and time.  Skin: Skin is warm and dry.  Psychiatric: She has a normal mood and affect. Her behavior is normal. Judgment and thought content normal.    Prenatal labs: ABO, Rh: --/--/B POS (11/11 1018) Antibody: POS (11/11 1018) Rubella: 9.8 (11/12 0805) RPR: NON REACTIVE (11/11 0748)  HBsAg: NEGATIVE (09/06 1409)  HIV: Non-reactive (11/12 1500)  GBS:    Results for orders placed during the hospital encounter of 06/10/11 (from  the past 24 hour(s))  CBC     Status: Abnormal   Collection Time   06/10/11  1:25 AM      Component Value Range   WBC 10.0  4.0 - 10.5 (K/uL)   RBC 3.33 (*) 3.87 - 5.11 (MIL/uL)   Hemoglobin 8.9 (*) 12.0 - 15.0 (g/dL)   HCT 13.0 (*) 86.5 - 46.0 (%)   MCV 81.4  78.0 - 100.0 (fL)   MCH 26.7  26.0 - 34.0 (pg)   MCHC 32.8  30.0 - 36.0 (g/dL)   RDW 78.4  69.6 - 29.5 (%)   Platelets 353  150 - 400 (K/uL)  COMPREHENSIVE METABOLIC PANEL     Status: Abnormal   Collection Time   06/10/11  1:25 AM      Component Value Range   Sodium 137   135 - 145 (mEq/L)   Potassium 4.1  3.5 - 5.1 (mEq/L)   Chloride 103  96 - 112 (mEq/L)   CO2 24  19 - 32 (mEq/L)   Glucose, Bld 103 (*) 70 - 99 (mg/dL)   BUN 12  6 - 23 (mg/dL)   Creatinine, Ser 2.84  0.50 - 1.10 (mg/dL)   Calcium 9.5  8.4 - 13.2 (mg/dL)   Total Protein 6.3  6.0 - 8.3 (g/dL)   Albumin 2.9 (*) 3.5 - 5.2 (g/dL)   AST 37  0 - 37 (U/L)   ALT 21  0 - 35 (U/L)   Alkaline Phosphatase 236 (*) 39 - 117 (U/L)   Total Bilirubin 0.4  0.3 - 1.2 (mg/dL)   GFR calc non Af Amer >90  >90 (mL/min)   GFR calc Af Amer >90  >90 (mL/min)  AMYLASE     Status: Normal   Collection Time   06/10/11  1:25 AM      Component Value Range   Amylase 67  0 - 105 (U/L)  LIPASE, BLOOD     Status: Normal   Collection Time   06/10/11  1:25 AM      Component Value Range   Lipase 42  11 - 59 (U/L)   Assessment/Plan: A: 25 y.o. presenting 10days post-partum with severe epigastric pain concerning for likely GERD 1. Epigastric pain:  When the patient initially presented, given her history and quality of her pain there was concern this could represent post-partum eclampsia.  However her labs were not indicative of this.  There was also concern for gallbladder issues vs. Pancreatitis but again, this was not supported in her lab work. Therefore it is possible that her pain is secondary to GERD.  In the future, the patient was advised to try OTC medications prior to calling EMS. 2. Hypertension: Perhaps the more important issue is that this patient has been unable to obtain her hypertensive medications.  Here she was given two doses of 10mg  labetalol to which she did not respond.  Then she was given hydralazine to which she also did not respond.  The patient was then given clonidine 0.2mg  po as well as doses of her Norvasc and HCTZ.  This was successful in controlling her pressures.  Prescriptions were given for Norvasc and HCTZ so patient could have them filled.  She was further instructed to make a follow-up  appointment [facility] for Monday Nov. 26th 3. Back Pain: Patient does have some lower back pain.  She was instructed to take PO tylenol of ibuprofen for this and follow-up with her regular PCP for further management.  Drucie Ip 06/10/2011, 2:18 AM

## 2011-06-18 ENCOUNTER — Ambulatory Visit: Payer: Medicaid Other | Admitting: Obstetrics & Gynecology

## 2011-06-27 ENCOUNTER — Emergency Department (HOSPITAL_COMMUNITY)
Admission: EM | Admit: 2011-06-27 | Discharge: 2011-06-27 | Disposition: A | Discharge status: Home / Self Care | Payer: Self-pay | Attending: Emergency Medicine | Admitting: Emergency Medicine

## 2011-06-27 ENCOUNTER — Encounter (HOSPITAL_COMMUNITY): Payer: Self-pay | Admitting: Emergency Medicine

## 2011-06-27 ENCOUNTER — Emergency Department (HOSPITAL_COMMUNITY): Payer: Self-pay

## 2011-06-27 DIAGNOSIS — J40 Bronchitis, not specified as acute or chronic: Secondary | ICD-10-CM | POA: Insufficient documentation

## 2011-06-27 DIAGNOSIS — I10 Essential (primary) hypertension: Secondary | ICD-10-CM | POA: Insufficient documentation

## 2011-06-27 DIAGNOSIS — R079 Chest pain, unspecified: Secondary | ICD-10-CM | POA: Insufficient documentation

## 2011-06-27 DIAGNOSIS — R0602 Shortness of breath: Secondary | ICD-10-CM | POA: Insufficient documentation

## 2011-06-27 DIAGNOSIS — R6883 Chills (without fever): Secondary | ICD-10-CM | POA: Insufficient documentation

## 2011-06-27 DIAGNOSIS — Z79899 Other long term (current) drug therapy: Secondary | ICD-10-CM | POA: Insufficient documentation

## 2011-06-27 DIAGNOSIS — R51 Headache: Secondary | ICD-10-CM | POA: Insufficient documentation

## 2011-06-27 DIAGNOSIS — J3489 Other specified disorders of nose and nasal sinuses: Secondary | ICD-10-CM | POA: Insufficient documentation

## 2011-06-27 DIAGNOSIS — R062 Wheezing: Secondary | ICD-10-CM | POA: Insufficient documentation

## 2011-06-27 DIAGNOSIS — R059 Cough, unspecified: Secondary | ICD-10-CM | POA: Insufficient documentation

## 2011-06-27 DIAGNOSIS — R05 Cough: Secondary | ICD-10-CM | POA: Insufficient documentation

## 2011-06-27 DIAGNOSIS — G40909 Epilepsy, unspecified, not intractable, without status epilepticus: Secondary | ICD-10-CM | POA: Insufficient documentation

## 2011-06-27 DIAGNOSIS — R1110 Vomiting, unspecified: Secondary | ICD-10-CM | POA: Insufficient documentation

## 2011-06-27 LAB — URINALYSIS, ROUTINE W REFLEX MICROSCOPIC
Bilirubin Urine: NEGATIVE
Glucose, UA: NEGATIVE mg/dL
Ketones, ur: NEGATIVE mg/dL
pH: 6 (ref 5.0–8.0)

## 2011-06-27 LAB — URINE MICROSCOPIC-ADD ON

## 2011-06-27 MED ORDER — PREDNISONE 20 MG PO TABS
60.0000 mg | ORAL_TABLET | Freq: Once | ORAL | Status: AC
  Administered 2011-06-27: 60 mg via ORAL
  Filled 2011-06-27: qty 3

## 2011-06-27 MED ORDER — ONDANSETRON HCL 4 MG PO TABS: 4.0000 mg | ORAL_TABLET | Freq: Four times a day (QID) | ORAL | Status: AC

## 2011-06-27 MED ORDER — ONDANSETRON HCL 4 MG PO TABS: 4.0000 mg | ORAL_TABLET | Freq: Four times a day (QID) | ORAL | Status: DC

## 2011-06-27 MED ORDER — PREDNISONE 20 MG PO TABS: 40.0000 mg | ORAL_TABLET | Freq: Every day | ORAL | Status: DC

## 2011-06-27 MED ORDER — ALBUTEROL SULFATE HFA 108 (90 BASE) MCG/ACT IN AERS
2.0000 | INHALATION_SPRAY | RESPIRATORY_TRACT | Status: DC | PRN
  Administered 2011-06-27: 2 via RESPIRATORY_TRACT
  Filled 2011-06-27: qty 6.7

## 2011-06-27 MED ORDER — PREDNISONE 20 MG PO TABS: 40.0000 mg | ORAL_TABLET | Freq: Every day | ORAL | Status: AC

## 2011-06-27 MED ORDER — ACETAMINOPHEN 325 MG PO TABS
650.0000 mg | ORAL_TABLET | Freq: Once | ORAL | Status: AC
  Administered 2011-06-27: 650 mg via ORAL
  Filled 2011-06-27: qty 2

## 2011-06-27 NOTE — ED Provider Notes (Signed)
History     CSN: [id number] Arrival date & time: 06/27/2011  6:18 PM   First MD Initiated Contact with Patient 06/27/11 1912      Chief Complaint  Patient presents with  . Cough    (Consider location/radiation/quality/duration/timing/severity/associated sxs/prior treatment) Patient is a 25 y.o. female presenting with cough. The history is provided by the patient.  Cough This is a new problem. The current episode started more than 1 week ago. The problem occurs constantly. The problem has been gradually worsening. The cough is productive of sputum. There has been no fever (Fever last week but none for the last 4 days.). Associated symptoms include chest pain, chills, headaches, rhinorrhea and shortness of breath. Pertinent negatives include no ear pain and no sore throat. She has tried decongestants for the symptoms. The treatment provided mild relief. She is not a smoker. Her past medical history does not include asthma.    Past Medical History  Diagnosis Date  . Hypertension   . Anemia   . Preterm labor   . No pertinent past medical history   . Hx MRSA infection   . Seizures     Past Surgical History  Procedure Date  . No past surgeries     Family History  Problem Relation Age of Onset  . Hypertension Father   . Hypertension Maternal Grandmother   . Cancer Paternal Grandmother   . Hypertension Paternal Grandmother   . Stroke Paternal Grandmother     History  Substance Use Topics  . Smoking status: Current Everyday Smoker -- 0.2 packs/day  . Smokeless tobacco: Never Used  . Alcohol Use: No    OB History    Grav Para Term Preterm Abortions TAB SAB Ect Mult Living   5 5 3 2  0 0 0 0 0 5      Review of Systems  Constitutional: Positive for chills.  HENT: Positive for rhinorrhea. Negative for ear pain and sore throat.   Respiratory: Positive for cough and shortness of breath.   Cardiovascular: Positive for chest pain.  Neurological: Positive for headaches.    All other systems reviewed and are negative.    Allergies  Promethazine hcl  Home Medications   Current Outpatient Rx  Name Route Sig Dispense Refill  . AMLODIPINE BESYLATE 5 MG PO TABS Oral Take 2 tablets (10 mg total) by mouth daily. 30 tablet 1  . HYDROCHLOROTHIAZIDE 25 MG PO TABS Oral Take 1 tablet (25 mg total) by mouth daily. 60 tablet 1    BP 150/99  Pulse 71  Temp(Src) 98.2 F (36.8 C) (Oral)  Resp 18  SpO2 100%  LMP 06/27/2011  Breastfeeding? No  Physical Exam  Nursing note and vitals reviewed. Constitutional: She is oriented to person, place, and time. She appears well-developed and well-nourished. No distress.  HENT:  Head: Normocephalic and atraumatic.  Eyes: EOM are normal. Pupils are equal, round, and reactive to light.  Neck: Normal range of motion. Neck supple.  Cardiovascular: Normal rate, regular rhythm, normal heart sounds and intact distal pulses.  Exam reveals no friction rub.   No murmur heard. Pulmonary/Chest: Effort normal. She has wheezes. She has rhonchi. She has no rales.  Abdominal: Soft. Bowel sounds are normal. She exhibits no distension. There is no tenderness. There is no rebound and no guarding.  Musculoskeletal: Normal range of motion. She exhibits no tenderness.       No edema  Neurological: She is alert and oriented to person, place, and time. No cranial  nerve deficit.  Skin: Skin is warm and dry. No rash noted.  Psychiatric: She has a normal mood and affect. Her behavior is normal.    ED Course  Procedures (including critical care time)  Labs Reviewed  URINALYSIS, ROUTINE W REFLEX MICROSCOPIC - Abnormal; Notable for the following:    Leukocytes, UA SMALL (*)    All other components within normal limits  URINE MICROSCOPIC-ADD ON - Abnormal; Notable for the following:    Squamous Epithelial / LPF FEW (*)    Bacteria, UA FEW (*)    All other components within normal limits   Dg Chest 2 View  06/27/2011  *RADIOLOGY REPORT*   Clinical Data: Cough, rhonchi.  Headache for 1 week.  CHEST - 2 VIEW  Comparison: 05/31/2011  Findings: Normal heart size and pulmonary vascularity.  Mild perihilar peribronchial streaky opacities suggesting chronic bronchitis.  No focal airspace consolidation.  No blunting of costophrenic angles.  No pneumothorax.  No significant changes since previous study.  IMPRESSION: No evidence of active pulmonary disease.  Original Report Authenticated By: Marlon Pel, M.D.     No diagnosis found.    MDM   Patient is here due to having vomiting, headache, productive cough. Patient is been in the hospital due to her 38-week-old child having pneumonia in the hospitalized right now. She states that she had similar upper respiratory symptoms for the past week and a half but they're just not getting any better. She was sleeping upstairs when she woke up and started vomiting and had a headache. Patient is hypertensive here at 150/99 but is afebrile. She does have a history of preeclampsia.  On exam the patient has diffuse rhonchi and wheezing in all lung fields, abdomen is soft with no focal abdominal pain. Will treat nausea and headache with Zofran and acetaminophen. Chest x-ray pending will recheck blood pressure.  9:40 PM Chest x-ray within normal limits. If you any without any signs of proteinuria. Blood pressure recheck 148/70. Will give inhaler and steroids for the wheezing and rhonchi on exam and have patient followup with PCP.      Gwyneth Sprout, MD 06/27/11 2140

## 2011-06-27 NOTE — ED Notes (Signed)
Pt reports cough x 1 week. Pt report that her child is admitted to hospital for pneumonia.

## 2011-06-27 NOTE — ED Notes (Signed)
Pt shows no signs of neuro deficits 

## 2011-06-27 NOTE — ED Notes (Signed)
Pt also c/o headache and nausea. 

## 2014-05-21 ENCOUNTER — Encounter (HOSPITAL_COMMUNITY): Payer: Self-pay | Admitting: Emergency Medicine

## 2017-12-31 ENCOUNTER — Emergency Department (HOSPITAL_COMMUNITY): Payer: Self-pay

## 2017-12-31 ENCOUNTER — Emergency Department (HOSPITAL_COMMUNITY)
Admission: EM | Admit: 2017-12-31 | Discharge: 2018-01-01 | Disposition: A | Discharge status: Home / Self Care | Payer: Self-pay | Attending: Emergency Medicine | Admitting: Emergency Medicine

## 2017-12-31 ENCOUNTER — Other Ambulatory Visit: Payer: Self-pay

## 2017-12-31 ENCOUNTER — Encounter (HOSPITAL_COMMUNITY): Payer: Self-pay | Admitting: *Deleted

## 2017-12-31 ENCOUNTER — Emergency Department (HOSPITAL_COMMUNITY): Admission: EM | Admit: 2017-12-31 | Discharge: 2017-12-31 | Discharge status: Against Medical Advice | Payer: Self-pay

## 2017-12-31 DIAGNOSIS — R079 Chest pain, unspecified: Secondary | ICD-10-CM | POA: Insufficient documentation

## 2017-12-31 DIAGNOSIS — Z5321 Procedure and treatment not carried out due to patient leaving prior to being seen by health care provider: Secondary | ICD-10-CM | POA: Insufficient documentation

## 2017-12-31 LAB — I-STAT BETA HCG BLOOD, ED (MC, WL, AP ONLY): I-stat hCG, quantitative: <5 m[IU]/mL (ref <5)

## 2017-12-31 LAB — CBC
HEMATOCRIT: 39.4 % (ref 36.0–46.0)
HEMOGLOBIN: 12.6 g/dL (ref 12.0–15.0)
MCH: 26.7 pg (ref 26.0–34.0)
MCHC: 32 g/dL (ref 30.0–36.0)
MCV: 83.5 fL (ref 78.0–100.0)
Platelets: 283 10*3/uL (ref 150–400)
RBC: 4.72 MIL/uL (ref 3.87–5.11)
RDW: 13.2 % (ref 11.5–15.5)
WBC: 7 10*3/uL (ref 4.0–10.5)

## 2017-12-31 LAB — BASIC METABOLIC PANEL
Anion gap: 11 (ref 5–15)
BUN: 7 mg/dL (ref 6–20)
CO2: 23 mmol/L (ref 22–32)
Calcium: 8.9 mg/dL (ref 8.9–10.3)
Chloride: 108 mmol/L (ref 101–111)
Creatinine, Ser: 0.8 mg/dL (ref 0.44–1.00)
GFR calc Af Amer: >60 mL/min (ref >60)
GLUCOSE: 92 mg/dL (ref 65–99)
POTASSIUM: 3.8 mmol/L (ref 3.5–5.1)
Sodium: 142 mmol/L (ref 135–145)

## 2017-12-31 LAB — I-STAT TROPONIN, ED: Troponin i, poc: 0 ng/mL (ref 0.00–0.08)

## 2017-12-31 MED ORDER — IBUPROFEN 400 MG PO TABS
400.0000 mg | ORAL_TABLET | Freq: Once | ORAL | Status: AC | PRN
  Administered 2017-12-31: 400 mg via ORAL
  Filled 2017-12-31: qty 1

## 2017-12-31 NOTE — ED Triage Notes (Signed)
Pt was driving through [place] from [place] and started having L sided chest pain radiating across chest with SOB.

## 2017-12-31 NOTE — ED Notes (Signed)
[facility] called and states that patient is trying to check in over there.

## 2017-12-31 NOTE — ED Notes (Signed)
Pt visitor blocking the triage door with patient's wheelchair demanding to be seen. He was made aware that we would triage the patient as soon as a room became available to triage her. Pt visitor not pleased, but remained in waiting. Triage nurse made aware of patient.

## 2017-12-31 NOTE — ED Notes (Addendum)
Pt did not answer when called for triage in the lobby, women's bathroom or out front.

## 2018-01-01 ENCOUNTER — Encounter (HOSPITAL_COMMUNITY): Payer: Self-pay

## 2018-01-01 ENCOUNTER — Emergency Department (HOSPITAL_COMMUNITY)
Admission: EM | Admit: 2018-01-01 | Discharge: 2018-01-01 | Disposition: A | Discharge status: Home / Self Care | Payer: Self-pay | Attending: Emergency Medicine | Admitting: Emergency Medicine

## 2018-01-01 ENCOUNTER — Emergency Department (HOSPITAL_COMMUNITY): Payer: Self-pay

## 2018-01-01 ENCOUNTER — Other Ambulatory Visit: Payer: Self-pay

## 2018-01-01 DIAGNOSIS — I10 Essential (primary) hypertension: Secondary | ICD-10-CM | POA: Insufficient documentation

## 2018-01-01 DIAGNOSIS — F17200 Nicotine dependence, unspecified, uncomplicated: Secondary | ICD-10-CM | POA: Insufficient documentation

## 2018-01-01 DIAGNOSIS — R079 Chest pain, unspecified: Secondary | ICD-10-CM

## 2018-01-01 LAB — I-STAT TROPONIN, ED: TROPONIN I, POC: 0 ng/mL (ref 0.00–0.08)

## 2018-01-01 LAB — CBC
HCT: 38.2 % (ref 36.0–46.0)
Hemoglobin: 12.4 g/dL (ref 12.0–15.0)
MCH: 27 pg (ref 26.0–34.0)
MCHC: 32.5 g/dL (ref 30.0–36.0)
MCV: 83.2 fL (ref 78.0–100.0)
PLATELETS: 277 10*3/uL (ref 150–400)
RBC: 4.59 MIL/uL (ref 3.87–5.11)
RDW: 13.4 % (ref 11.5–15.5)
WBC: 6.5 10*3/uL (ref 4.0–10.5)

## 2018-01-01 LAB — BASIC METABOLIC PANEL
BUN: 12 mg/dL (ref 6–20)
CHLORIDE: 107 mmol/L (ref 101–111)
CREATININE: 0.78 mg/dL (ref 0.44–1.00)
Calcium: 8.9 mg/dL (ref 8.9–10.3)
GFR calc Af Amer: >60 mL/min (ref >60)
GFR calc non Af Amer: >60 mL/min (ref >60)
Glucose, Bld: 90 mg/dL (ref 65–99)
POTASSIUM: 4 mmol/L (ref 3.5–5.1)
SODIUM: 140 mmol/L (ref 135–145)

## 2018-01-01 LAB — I-STAT BETA HCG BLOOD, ED (MC, WL, AP ONLY): I-stat hCG, quantitative: <5 m[IU]/mL (ref <5)

## 2018-01-01 MED ORDER — NAPROXEN 500 MG PO TABS: 500.0000 mg | ORAL_TABLET | Freq: Two times a day (BID) | ORAL | 0 refills | Status: AC

## 2018-01-01 MED ORDER — AMOXICILLIN 500 MG PO CAPS: 500.0000 mg | ORAL_CAPSULE | Freq: Three times a day (TID) | ORAL | 0 refills | Status: AC

## 2018-01-01 MED ORDER — LISINOPRIL 10 MG PO TABS: 10.0000 mg | ORAL_TABLET | Freq: Every day | ORAL | 1 refills | Status: AC

## 2018-01-01 MED ORDER — ACETAMINOPHEN 500 MG PO TABS
1000.0000 mg | ORAL_TABLET | Freq: Once | ORAL | Status: AC
  Administered 2018-01-01: 1000 mg via ORAL
  Filled 2018-01-01: qty 2

## 2018-01-01 NOTE — ED Triage Notes (Signed)
Pt reports chest pain, face pain, vomiting, and headache that started yesterday, other than headache, which pt reports she has had for a couple of days. [facility] and left prior to being treated.

## 2018-01-01 NOTE — ED Notes (Signed)
C/o dental pain.  Rates 8/10.

## 2018-01-01 NOTE — ED Provider Notes (Signed)
[facility] EMERGENCY DEPARTMENT Provider Note   CSN: [id number] Arrival date & time: 01/01/18  7829     History   Chief Complaint Chief Complaint  Patient presents with  . Chest Pain    HPI Kathy Hutchinson is a 32 y.o. female.  Left-sided chest pain with radiation to the right chest since yesterday intermittently.  Symptoms last anywhere from 5 to 15 minutes.  No obvious dyspnea or diaphoresis.  She did have some minimal blood-tinged emesis.  Past medical history includes hypertension for which she has been noncompliant with her medicines.  Positive cigarette smoking.  She was seen at [facility] last night but left prior to final evaluation.  Review of systems positive for dental pain in her bilateral lower and right upper teeth.  Nothing makes symptoms better or worse.  Severity is mild to moderate     Past Medical History:  Diagnosis Date  . Anemia   . Hx MRSA infection   . Hypertension   . No pertinent past medical history   . Preterm labor   . Seizures [facility])     Patient Active Problem List   Diagnosis Date Noted  . Panic anxiety syndrome 05/31/2011  . Postpartum eclampsia 05/31/2011  . Hx MRSA infection     Past Surgical History:  Procedure Laterality Date  . NO PAST SURGERIES       OB History    Gravida  5   Para  5   Term  3   Preterm  2   AB  0   Living  5     SAB  0   TAB  0   Ectopic  0   Multiple  0   Live Births  1            Home Medications    Prior to Admission medications   Medication Sig Start Date End Date Taking? Authorizing Provider  amoxicillin (AMOXIL) 500 MG capsule Take 1 capsule (500 mg total) by mouth 3 (three) times daily. 01/01/18   Donnetta Hutching, MD  lisinopril (PRINIVIL,ZESTRIL) 10 MG tablet Take 1 tablet (10 mg total) by mouth daily. 01/01/18   Donnetta Hutching, MD  naproxen (NAPROSYN) 500 MG tablet Take 1 tablet (500 mg total) by mouth 2 (two) times daily. 01/01/18   Donnetta Hutching, MD    Family History Family  History  Problem Relation Age of Onset  . Hypertension Father   . Hypertension Maternal Grandmother   . Cancer Paternal Grandmother   . Hypertension Paternal Grandmother   . Stroke Paternal Grandmother     Social History Social History   Tobacco Use  . Smoking status: Current Every Day Smoker    Packs/day: 0.25  . Smokeless tobacco: Never Used  Substance Use Topics  . Alcohol use: No  . Drug use: No     Allergies   Promethazine hcl   Review of Systems Review of Systems  All other systems reviewed and are negative.    Physical Exam Updated Vital Signs BP (!) 154/100 (BP Location: Left Arm)   Pulse 66   Temp 98.5 F (36.9 C) (Oral)   Resp 18   Ht 5\' 1"  (1.549 m)   Wt 63.5 kg (140 lb)   LMP 12/31/2017 (Approximate) Comment: pt shielded  SpO2 100%   BMI 26.45 kg/m   Physical Exam  Constitutional: She is oriented to person, place, and time. She appears well-developed and well-nourished.  HENT:  Head: Normocephalic and atraumatic.  Significant  caries in mouth  Eyes: Conjunctivae are normal.  Neck: Neck supple.  Cardiovascular: Normal rate and regular rhythm.  Pulmonary/Chest: Effort normal and breath sounds normal.  Abdominal: Soft. Bowel sounds are normal.  Musculoskeletal: Normal range of motion.  Neurological: She is alert and oriented to person, place, and time.  Skin: Skin is warm and dry.  Psychiatric: She has a normal mood and affect. Her behavior is normal.  Nursing note and vitals reviewed.    ED Treatments / Results  Labs (all labs ordered are listed, but only abnormal results are displayed) Labs Reviewed  BASIC METABOLIC PANEL  CBC  I-STAT BETA HCG BLOOD, ED ([facility], [facility], [facility] ONLY)  I-STAT TROPONIN, ED    EKG EKG Interpretation  Date/Time:  Saturday January 01 2018 06:24:34 EDT Ventricular Rate:  74 PR Interval:    QRS Duration: 80 QT Interval:  402 QTC Calculation: 446 R Axis:   53 Text Interpretation:  Sinus rhythm Right atrial  enlargement No significant change was found Confirmed by Glynn Octaveancour, Stephen [phone]) on 01/01/2018 6:38:21 AM Also confirmed by Glynn Octaveancour, Stephen [phone]), editor [address], Shanda BumpsJessica ([phone])  on 01/01/2018 11:12:54 AM   Radiology Dg Chest 2 View  Result Date: 01/01/2018 CLINICAL DATA:  Chest pain EXAM: CHEST - 2 VIEW COMPARISON:  12/31/2017 FINDINGS: Nodular densities project over both lower lungs, likely nipple shadows. Heart is normal size. Lungs clear. No effusions or acute bony abnormality. IMPRESSION: No active cardiopulmonary disease. Electronically Signed   By: Charlett NoseKevin  Dover M.D.   On: 01/01/2018 07:24   Dg Chest 2 View  Result Date: 12/31/2017 CLINICAL DATA:  Left-sided chest pain with shortness of breath. EXAM: CHEST - 2 VIEW COMPARISON:  06/27/2011 FINDINGS: Midline trachea.  Normal heart size and mediastinal contours. Sharp costophrenic angles.  No pneumothorax.  Clear lungs. IMPRESSION: No active cardiopulmonary disease. Electronically Signed   By: Jeronimo GreavesKyle  Talbot M.D.   On: 12/31/2017 21:12    Procedures Procedures (including critical care time)  Medications Ordered in ED Medications  acetaminophen (TYLENOL) tablet 1,000 mg (1,000 mg Oral Given 01/01/18 0932)     Initial Impression / Assessment and Plan / ED Course  I have reviewed the triage vital signs and the nursing notes.  Pertinent labs & imaging results that were available during my care of the patient were reviewed by me and considered in my medical decision making (see chart for details).     Patient presents with atypical chest pain.  She is low risk for ACS or PE.  Screening tests including EKG, chest x-ray, labs, troponin all negative.  Will initiate amoxicillin 500 mg for her multiple dental caries.  Referral to dentist and primary care.  Final Clinical Impressions(s) / ED Diagnoses   Final diagnoses:  Chest pain, unspecified type  Hypertension, unspecified type    ED Discharge Orders        Ordered    lisinopril  (PRINIVIL,ZESTRIL) 10 MG tablet  Daily     01/01/18 1051    amoxicillin (AMOXIL) 500 MG capsule  3 times daily     01/01/18 1051    naproxen (NAPROSYN) 500 MG tablet  2 times daily     01/01/18 1051       Donnetta Hutchingook, Estefanie Cornforth, MD 01/01/18 1327

## 2018-01-01 NOTE — Discharge Instructions (Addendum)
Tests were within normal limits.  Prescription for blood pressure medicine, antibiotic, pain medicine.  For dental follow-up, try Dr. Mayford Knifeurner at [phone]

## 2019-02-05 IMAGING — DX DG CHEST 2V
2 series · 2 of 2 positions shown · non-contrast
Comparison: 12/31/2017

CLINICAL DATA: Chest pain

EXAM:
CHEST - 2 VIEW

[chest pa]
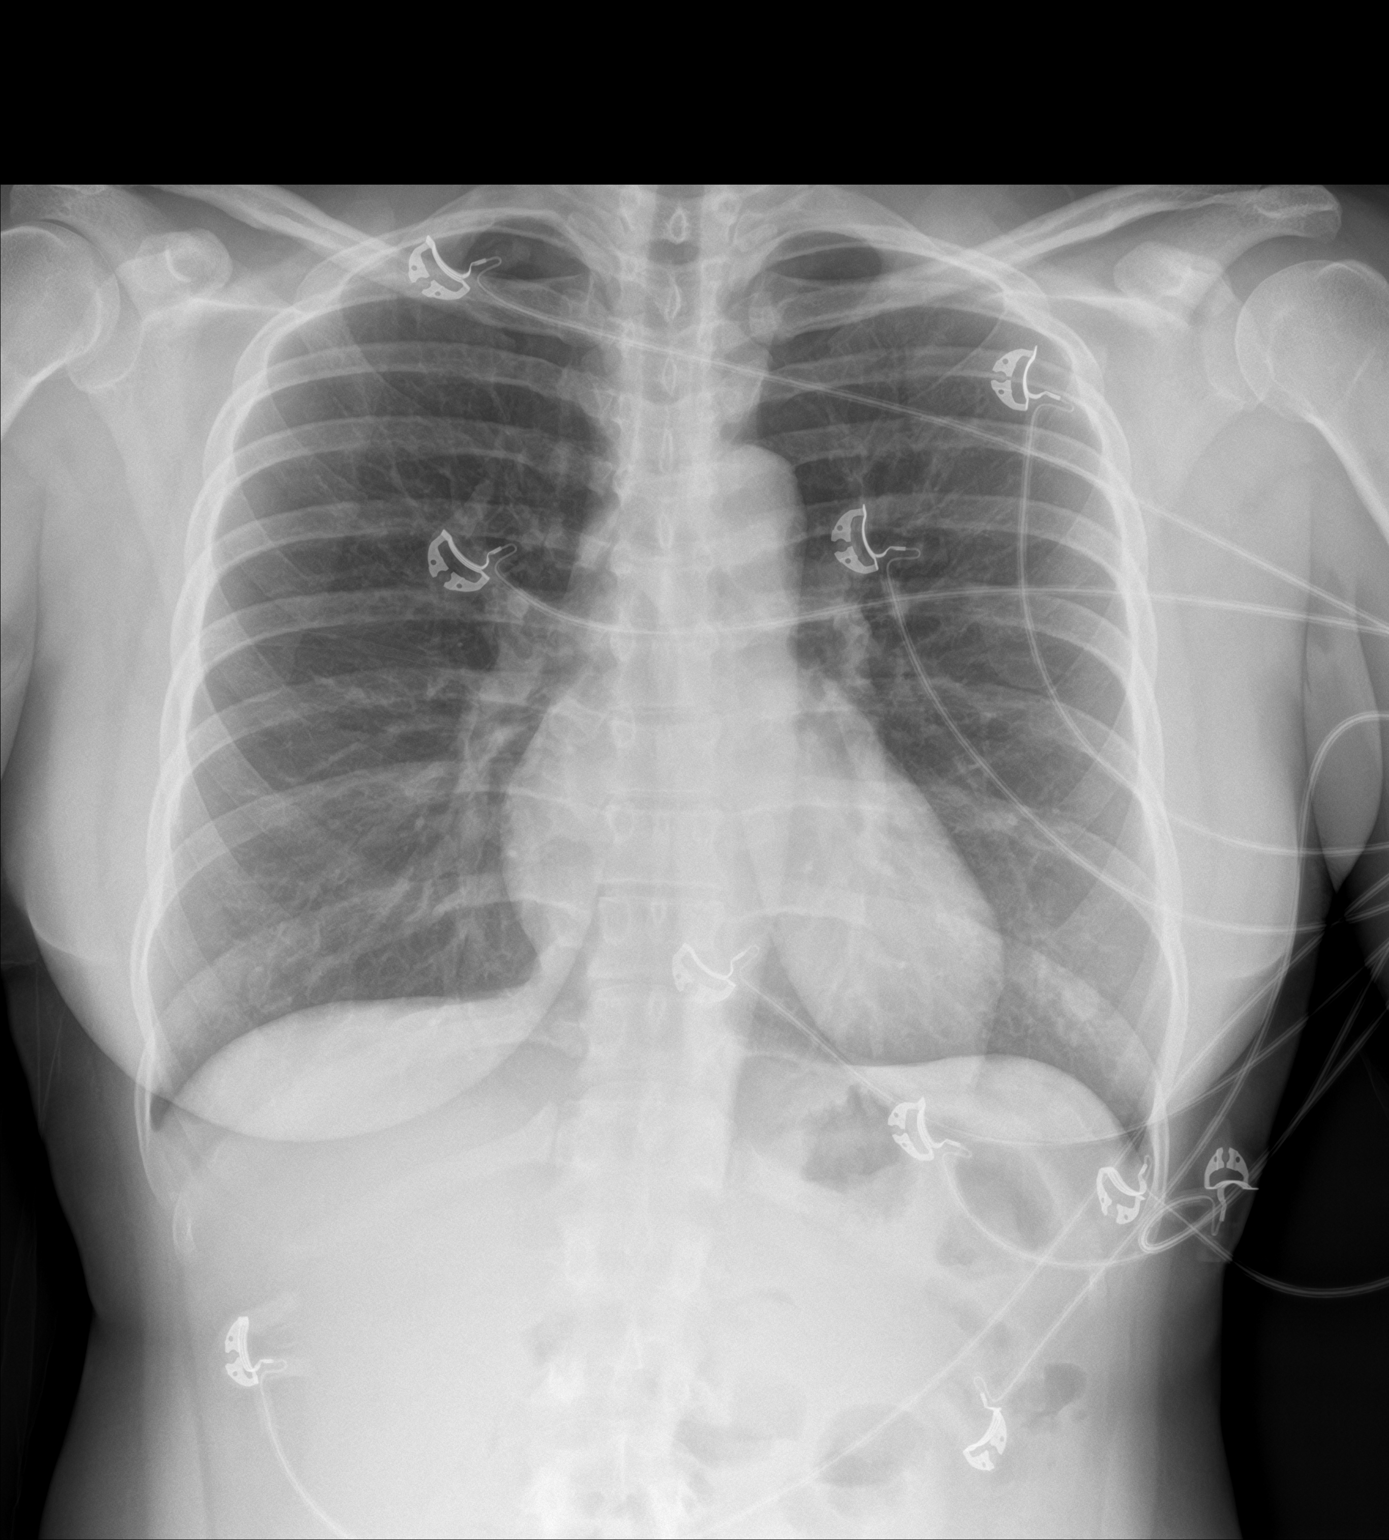

[chest lat]
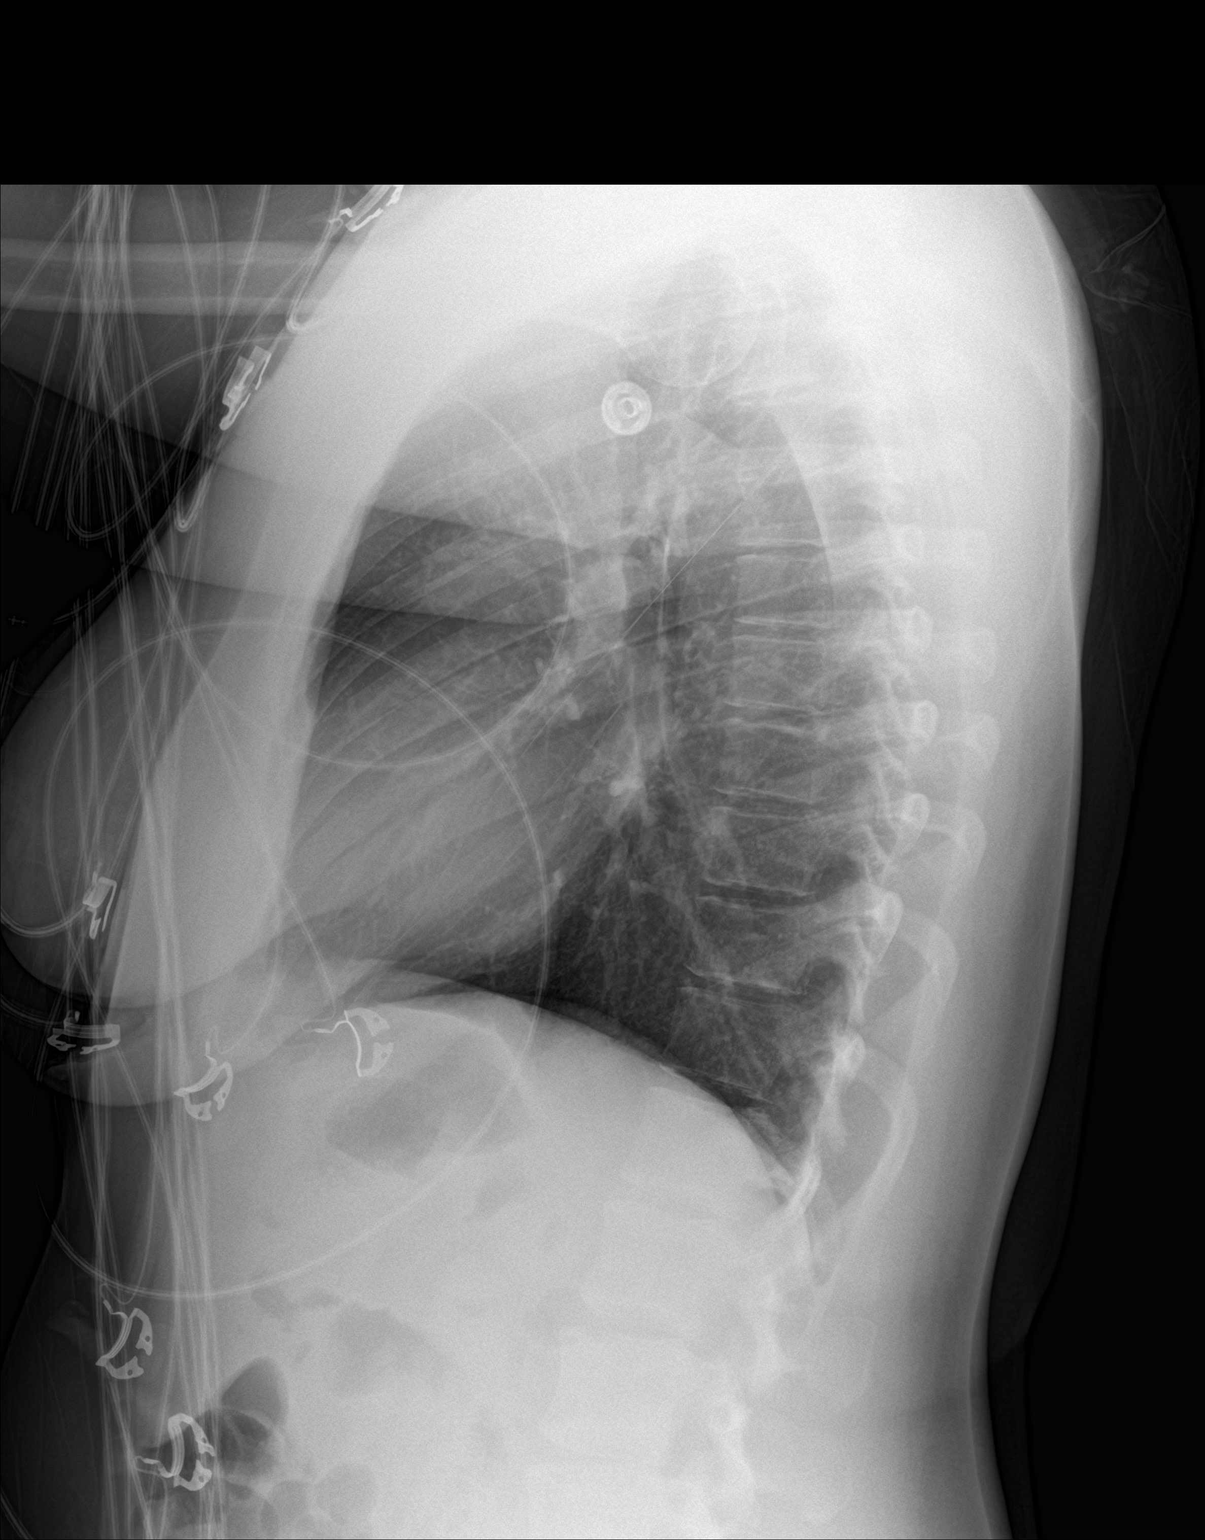

[2 of 2 positions shown; findings below may reference images not displayed]

FINDINGS: Nodular densities project over both lower lungs, likely nipple
shadows. Heart is normal size. Lungs clear. No effusions or acute
bony abnormality.
IMPRESSION: No active cardiopulmonary disease.
